# Patient Record
Sex: Male | Born: 1940 | Race: Black or African American | Hispanic: No | Marital: Married | Smoking: Never smoker
Health system: Southern US, Community
[De-identification: ages and names within clinical notes are randomized; demographics above are authoritative.]

## PROBLEM LIST (undated history)

## (undated) DIAGNOSIS — I2699 Other pulmonary embolism without acute cor pulmonale: Secondary | ICD-10-CM

## (undated) DIAGNOSIS — I10 Essential (primary) hypertension: Secondary | ICD-10-CM

## (undated) DIAGNOSIS — E119 Type 2 diabetes mellitus without complications: Secondary | ICD-10-CM

## (undated) DIAGNOSIS — E785 Hyperlipidemia, unspecified: Secondary | ICD-10-CM

## (undated) HISTORY — DX: Hyperlipidemia, unspecified: E78.5

## (undated) HISTORY — DX: Essential (primary) hypertension: I10

## (undated) HISTORY — DX: Other pulmonary embolism without acute cor pulmonale: I26.99

## (undated) HISTORY — DX: Type 2 diabetes mellitus without complications: E11.9

---

## 2019-04-10 ENCOUNTER — Ambulatory Visit: Payer: Self-pay

## 2019-04-28 ENCOUNTER — Ambulatory Visit: Payer: Medicare Other

## 2019-05-12 ENCOUNTER — Ambulatory Visit: Payer: Medicare Other | Attending: Internal Medicine

## 2019-05-12 DIAGNOSIS — Z23 Encounter for immunization: Secondary | ICD-10-CM

## 2019-05-12 NOTE — Progress Notes (Signed)
   Covid-19 Vaccination Clinic  Name:  Charles Townsend    MRN: [id number] DOB: Mar 26, 1940  05/12/2019  [name] was observed post Covid-19 immunization for 15 minutes without incident. He was provided with Vaccine Information Sheet and instruction to access the V-Safe system.   [name] was instructed to call 911 with any severe reactions post vaccine: Marland Kitchen Difficulty breathing  . Swelling of face and throat  . A fast heartbeat  . A bad rash all over body  . Dizziness and weakness   Immunizations Administered    Name Date Dose VIS Date Route   Pfizer COVID-19 Vaccine 05/12/2019  8:12 AM 0.3 mL 03/01/2018 Intramuscular   Manufacturer: [place]   Lot: [id number]   [place]: [id number]

## 2019-06-06 ENCOUNTER — Ambulatory Visit: Payer: Medicare Other | Attending: Internal Medicine

## 2019-06-06 DIAGNOSIS — Z23 Encounter for immunization: Secondary | ICD-10-CM

## 2019-06-06 NOTE — Progress Notes (Signed)
   Covid-19 Vaccination Clinic  Name:  Charles Townsend    MRN: [id number] DOB: 1940/10/05  06/06/2019  Mr. [name] was observed post Covid-19 immunization for 15 minutes without incident. He was provided with Vaccine Information Sheet and instruction to access the V-Safe system.   Mr. [name] was instructed to call 911 with any severe reactions post vaccine: Marland Kitchen Difficulty breathing  . Swelling of face and throat  . A fast heartbeat  . A bad rash all over body  . Dizziness and weakness   Immunizations Administered    Name Date Dose VIS Date Route   Pfizer COVID-19 Vaccine 06/06/2019  8:09 AM 0.3 mL 03/01/2018 Intramuscular   Manufacturer: [place]   Lot: [id number]   [place]: [id number]

## 2019-07-17 ENCOUNTER — Other Ambulatory Visit: Payer: Self-pay

## 2019-07-17 ENCOUNTER — Other Ambulatory Visit: Payer: Self-pay | Admitting: Infectious Diseases

## 2019-07-17 ENCOUNTER — Ambulatory Visit
Admission: RE | Admit: 2019-07-17 | Discharge: 2019-07-17 | Disposition: A | Discharge status: Home / Self Care | Payer: Medicare Other | Source: Ambulatory Visit | Attending: Infectious Diseases | Admitting: Infectious Diseases

## 2019-07-17 DIAGNOSIS — S0990XD Unspecified injury of head, subsequent encounter: Secondary | ICD-10-CM | POA: Diagnosis not present

## 2019-07-18 ENCOUNTER — Other Ambulatory Visit: Payer: Self-pay | Admitting: Infectious Diseases

## 2019-07-18 ENCOUNTER — Ambulatory Visit
Admission: RE | Admit: 2019-07-18 | Discharge: 2019-07-18 | Disposition: A | Discharge status: Home / Self Care | Payer: Medicare Other | Source: Ambulatory Visit | Attending: Infectious Diseases | Admitting: Infectious Diseases

## 2019-07-18 DIAGNOSIS — S065X9A Traumatic subdural hemorrhage with loss of consciousness of unspecified duration, initial encounter: Secondary | ICD-10-CM | POA: Diagnosis not present

## 2019-07-18 DIAGNOSIS — S065XAA Traumatic subdural hemorrhage with loss of consciousness status unknown, initial encounter: Secondary | ICD-10-CM

## 2019-11-02 ENCOUNTER — Other Ambulatory Visit
Admission: RE | Admit: 2019-11-02 | Discharge: 2019-11-02 | Disposition: A | Discharge status: Home / Self Care | Payer: Medicare Other | Source: Ambulatory Visit | Attending: Podiatry | Admitting: Podiatry

## 2019-11-02 DIAGNOSIS — L03116 Cellulitis of left lower limb: Secondary | ICD-10-CM | POA: Diagnosis present

## 2019-11-07 LAB — AEROBIC/ANAEROBIC CULTURE W GRAM STAIN (SURGICAL/DEEP WOUND)

## 2020-01-09 ENCOUNTER — Ambulatory Visit: Payer: Medicare Other | Attending: Internal Medicine

## 2020-01-09 ENCOUNTER — Other Ambulatory Visit: Payer: Self-pay

## 2020-01-09 DIAGNOSIS — Z23 Encounter for immunization: Secondary | ICD-10-CM

## 2020-01-09 NOTE — Progress Notes (Signed)
   Covid-19 Vaccination Clinic  Name:  Charles Townsend    MRN: [id number] DOB: Oct 17, 1940  01/09/2020  Charles Townsend was observed post Covid-19 immunization for 15 minutes without incident. He was provided with Vaccine Information Sheet and instruction to access the V-Safe system.   Charles Townsend was instructed to call 911 with any severe reactions post vaccine: Marland Kitchen Difficulty breathing  . Swelling of face and throat  . A fast heartbeat  . A bad rash all over body  . Dizziness and weakness   Immunizations Administered    Name Date Dose VIS Date Route   Pfizer COVID-19 Vaccine 01/09/2020  2:16 PM 0.3 mL 10/25/2019 Intramuscular   Manufacturer: [employer], [employer]   Lot: [id number]   [place]: [address]-3

## 2020-04-04 ENCOUNTER — Other Ambulatory Visit: Payer: Self-pay | Admitting: Physician Assistant

## 2020-04-04 ENCOUNTER — Other Ambulatory Visit: Payer: Self-pay | Admitting: Student

## 2020-04-04 DIAGNOSIS — S069X9S Unspecified intracranial injury with loss of consciousness of unspecified duration, sequela: Secondary | ICD-10-CM

## 2020-04-04 DIAGNOSIS — I639 Cerebral infarction, unspecified: Secondary | ICD-10-CM

## 2020-04-22 ENCOUNTER — Ambulatory Visit
Admission: RE | Admit: 2020-04-22 | Discharge: 2020-04-22 | Disposition: A | Discharge status: Home / Self Care | Payer: Medicare Other | Source: Ambulatory Visit | Attending: Physician Assistant | Admitting: Physician Assistant

## 2020-04-22 ENCOUNTER — Other Ambulatory Visit: Payer: Self-pay

## 2020-04-22 DIAGNOSIS — I639 Cerebral infarction, unspecified: Secondary | ICD-10-CM | POA: Insufficient documentation

## 2020-06-26 ENCOUNTER — Ambulatory Visit: Payer: Medicare Other | Attending: Physician Assistant

## 2020-06-26 DIAGNOSIS — R2689 Other abnormalities of gait and mobility: Secondary | ICD-10-CM | POA: Insufficient documentation

## 2020-06-26 DIAGNOSIS — R2681 Unsteadiness on feet: Secondary | ICD-10-CM | POA: Diagnosis not present

## 2020-06-26 NOTE — Patient Instructions (Signed)
  Access Code: [email or web address] URL: [email or web address] Date: 06/26/2020 Prepared by: Janna Arch  Exercises Standing March with Unilateral Counter Support - 1 x daily - 7 x weekly - 2 sets - 10 reps - 5 hold Standing Tandem Balance with Counter Support - 1 x daily - 7 x weekly - 2 sets - 2 reps - 30 hold Seated Heel Toe Raises - 1 x daily - 7 x weekly - 2 sets - 10 reps - 5 hold Sit to Stand Without Arm Support - 1 x daily - 7 x weekly - 2 sets - 10 reps - 5 hold

## 2020-06-26 NOTE — Therapy (Signed)
[place] MAIN [facility] SERVICES [address] [place], [place], [address] Phone: [phone]   Fax:  [phone]  Physical Therapy Evaluation  Patient Details  Name: Charles Townsend MRN: [id number] Date of Birth: [birth year] Referring Provider (PT): Wonda Cerise  Encounter Date: 06/26/2020   PT End of Session - 06/26/20 1723     Visit Number 1    Number of Visits 24    Date for PT Re-Evaluation 09/18/20    Authorization Type 1/10 eval 06/26/20    PT Start Time 5188    PT Stop Time 1711    PT Time Calculation (min) 56 min    Equipment Utilized During Treatment Gait belt    Activity Tolerance Patient tolerated treatment well    Behavior During Therapy [facility] for tasks assessed/performed             History reviewed. No pertinent past medical history.  History reviewed. No pertinent surgical history.  There were no vitals filed for this visit.    Subjective Assessment - 06/26/20 1622     Subjective Patient is a pleasant 80 year old who presents for balance and walking.    Pertinent History Patient is a pleasant 80 year old who presents for balance and walking. Reports it has began about a year ago and has gotten progressively worse.  When he tries to walk he falls .Patient uses a cane mostly, stumbles frequently and falls 3x/month, normally forward.  PMH includes blindness in R eye, type II diabetes, HTN, HLD, stage 3a CKD, CHB, chronic renal failure, b12 deficiency, macular degeneration, hypercholesterolemia, PCO, cardiac pacemaker, PVCs, neuropathy. CT was clear with no evidence of acute intracranial abnormalities.    Limitations Lifting;Standing;Walking;House hold activities    How long can you sit comfortably? n/a    How long can you stand comfortably? 30 mins    How long can you walk comfortably? as he walks he starts to drift    Patient Stated Goals increase his walking, decrease falls    Currently in Pain? No/denies                  Missing toe amputation L foot   PAIN: Patient reports no pain.   POSTURE: Slight left lean in seated and standing. Posterior trunk sway in standing   PROM/AROM:  AROM BUE WFL  AROM BLE:  WFL  STRENGTH:  Graded on a 0-5 scale Muscle Group Left Right  Hip Flex 4/5 4/5  Hip Abd 4/5 4/5  Hip Add 4/5 4/5  Hip Ext 3/5 3/5  Hip IR/ER /5 /5  Knee Flex 4-/5 4-/5  Knee Ext 4/5 4/5  Ankle DF 4-/5 4-/5  Ankle PF 4-/5 4-/5   SENSATION:     SOMATOSENSORY:  Any N & T in extremities or weakness: reports :         Sensation           Intact      Diminished         Absent  Light touch LEs                              COORDINATION: Finger to Nose: Dysmetric with past pointing of RUE >LUE           Heel Shin Slide Test: [facility]   SPECIAL TESTS: Cranial Nerves/Vision : able to track with L eye, unable to see out of R eye.  FUNCTIONAL MOBILITY: STS: performed with one posterior LOB   BALANCE: Static Sitting Balance  Normal Able to maintain balance against maximal resistance   Good Able to maintain balance against moderate resistance   Good-/Fair+ Accepts minimal resistance x  Fair Able to sit unsupported without balance loss and without UE support   Poor+ Able to maintain with Minimal assistance from individual or chair   Poor Unable to maintain balance-requires mod/max support from individual or chair    Static Standing Balance  Normal Able to maintain standing balance against maximal resistance   Good Able to maintain standing balance against moderate resistance   Good-/Fair+ Able to maintain standing balance against minimal resistance   Fair Able to stand unsupported without UE support and without LOB for 1-2 min x  Fair- Requires Min A and UE support to maintain standing without loss of balance   Poor+ Requires mod A and UE support to maintain standing without loss of balance   Poor Requires max A and UE support to maintain standing balance without loss     Dynamic Sitting Balance  Normal Able to sit unsupported and weight shift across midline maximally   Good Able to sit unsupported and weight shift across midline moderately   Good-/Fair+ Able to sit unsupported and weight shift across midline minimally   Fair Minimal weight shifting ipsilateral/front, difficulty crossing midline x  Fair- Reach to ipsilateral side and unable to weight shift   Poor + Able to sit unsupported with min A and reach to ipsilateral side, unable to weight shift   Poor Able to sit unsupported with mod A and reach ipsilateral/front-can't cross midline    Standing Dynamic Balance  Normal Stand independently unsupported, able to weight shift and cross midline maximally   Good Stand independently unsupported, able to weight shift and cross midline moderately   Good-/Fair+ Stand independently unsupported, able to weight shift across midline minimally   Fair Stand independently unsupported, weight shift, and reach ipsilaterally, loss of balance when crossing midline x  Poor+ Able to stand with Min A and reach ipsilaterally, unable to weight shift   Poor Able to stand with Mod A and minimally reach ipsilaterally, unable to cross midline.      GAIT: Bilateral feet shuffling, use of SPC, weaving L and R, requires PT assistance to not lose balance  OUTCOME MEASURES: TEST Outcome Interpretation  DGI with cane 10/24           6 minute walk test    1100            Feet; frequent near LOB 1000 feet is community Conservator, museum/gallery Assessment 36/56 <36/56 (100% risk for falls), 37-45 (80% risk for falls); 46-51 (>50% risk for falls); 52-55 (lower risk <25% of falls)  FOTO 50% Predicted d/c score of 62%       [facility] PT Assessment - 06/26/20 0001       Assessment   Medical Diagnosis balance/gait deficit    Referring Provider (PT) Wonda Cerise    Onset Date/Surgical Date --   about a year ago   Hand Dominance Right    Prior Therapy yes for knees       Precautions   Precautions ICD/Pacemaker;Fall      Restrictions   Weight Bearing Restrictions No      Balance Screen   Has the patient fallen in the past 6 months Yes    How many times? 8 or more times    Has  the patient had a decrease in activity level because of a fear of falling?  Yes    Is the patient reluctant to leave their home because of a fear of falling?  No      Home Environment   Living Environment Private residence    Living Arrangements Spouse/significant other    Available Help at Discharge Family    Type of [place] Access Level entry   but three steps to go down   Home Layout One level   one step in living [place] - single point;Grab bars - toilet;Grab bars - tub/shower;Shower seat      Prior Function   Level of Independence Independent    Vocation Retired    Leisure watch, walking, drive and have adventures      Cognition   Overall Cognitive Status Within Functional Limits for tasks assessed      Observation/Other Assessments   Focus on Therapeutic Outcomes (FOTO)  50%      Standardized Balance Assessment   Standardized Balance Assessment Berg Balance Test;Dynamic Gait Index      Berg Balance Test   Sit to Stand Able to stand  independently using hands    Standing Unsupported Able to stand 2 minutes with supervision    Sitting with Back Unsupported but Feet Supported on Floor or Stool Able to sit safely and securely 2 minutes    Stand to Sit Controls descent by using hands    Transfers Able to transfer safely, definite need of hands    Standing Unsupported with Eyes Closed Able to stand 3 seconds    Standing Unsupported with Feet Together Able to place feet together independently but unable to hold for 30 seconds    From Standing, Reach Forward with Outstretched Arm Can reach forward >5 cm safely (2")    From Standing Position, Pick up Object from Floor Able to pick up shoe, needs supervision    From Standing Position, Turn to  Look Behind Over each Shoulder Looks behind one side only/other side shows less weight shift    Turn 360 Degrees Able to turn 360 degrees safely but slowly    Standing Unsupported, Alternately Place Feet on Step/Stool Able to stand independently and complete 8 steps >20 seconds    Standing Unsupported, One Foot in Front Able to take small step independently and hold 30 seconds    Standing on One Leg Tries to lift leg/unable to hold 3 seconds but remains standing independently    Total Score 36      Dynamic Gait Index   Level Surface Mild Impairment   uses SPC   Change in Gait Speed Moderate Impairment    Gait with Horizontal Head Turns Moderate Impairment    Gait with Vertical Head Turns Moderate Impairment    Gait and Pivot Turn Moderate Impairment    Step Over Obstacle Moderate Impairment    Step Around Obstacles Moderate Impairment    Steps Mild Impairment    Total Score 10            Access Code: [email or web address] URL: [email or web address] Date: 06/26/2020 Prepared by: Janna Arch  Exercises Standing March with Unilateral Counter Support - 1 x daily - 7 x weekly - 2 sets - 10 reps - 5 hold Standing Tandem Balance with Counter Support - 1 x daily - 7 x weekly - 2 sets - 2 reps - 30 hold Seated Heel Toe Raises - 1 x  daily - 7 x weekly - 2 sets - 10 reps - 5 hold Sit to Stand Without Arm Support - 1 x daily - 7 x weekly - 2 sets - 10 reps - 5 hold          Objective measurements completed on examination: See above findings.               PT Education - 06/26/20 1723     Education Details goals, POC, HEP    Person(s) Educated Patient;Spouse    Methods Explanation;Demonstration;Tactile cues;Verbal cues;Handout    Comprehension Verbalized understanding;Returned demonstration;Verbal cues required;Tactile cues required              PT Short Term Goals - 06/26/20 1727       PT SHORT TERM GOAL #1   Title Patient will be independent in home  exercise program to improve strength/mobility for better functional independence with ADLs    Baseline 6/22: HEP given    Time 4    Period Weeks    Status New    Target Date 07/24/20               PT Long Term Goals - 06/26/20 1728       PT LONG TERM GOAL #1   Title Patient will increase FOTO score to equal to or greater than  62%   to demonstrate statistically significant improvement in mobility and quality of life.    Baseline 6/22: 50%    Time 12    Period Weeks    Status New    Target Date 09/18/20      PT LONG TERM GOAL #2   Title Patient will increase Berg Balance score by > 6 points (42/56)  to demonstrate decreased fall risk during functional activities.    Baseline 6/22: 36/56    Time 12    Period Weeks    Status New    Target Date 09/18/20      PT LONG TERM GOAL #3   Title Patient will increase six minute walk test distance to >1500 without LOB for progression to age norm community ambulator and improve gait ability    Baseline 6/22: 1100 ft with frequent LOB and use of SPC    Time 12    Period Weeks    Status New    Target Date 09/18/20      PT LONG TERM GOAL #4   Title Patient will increase dynamic gait index score to >19/24 as to demonstrate reduced fall risk and improved dynamic gait balance for better safety with community/home ambulation.    Baseline 6/22: 10/24 with cane    Time 12    Period Weeks    Status New    Target Date 09/18/20                    Plan - 06/26/20 1724     Clinical Impression Statement Patient is a pleasant 80 year old man who presents to physical therapy for gait and instability. Patient is highly motivated and has a good prognosis. He has vision loss in R eye resulting in frequent bumping into objects. Prolonged ambulation with bilateral foot drag and weaving L and R as he fatigues. Sit to stands result in occasional posterior LOB as he has posterior trunk sway. DGI scored 10/24 with SPC and BERG with 36/56. He  performed 6 min walk test with 1100 ft with SPC and frequent near LOB. Patient will benefit from skilled  physical therapy to improve balance, gait mechanics, and overall mobility for decreased fall risk and improved quality of life.    Personal Factors and Comorbidities Age;Comorbidity 3+;Social Background;Time since onset of injury/illness/exacerbation;Fitness;Transportation    Comorbidities blindness in R eye, type II diabetes, HTN, HLD, stage 3a CKD, CHB, chronic renal failure, b12 deficiency, macular degeneration, hypercholesterolemia, PCO, cardiac pacemaker, PVCs, neuropathy    Examination-Activity Limitations Bed Mobility;Bend;Carry;Dressing;Reach Overhead;Locomotion Level;Lift;Squat;Stairs;Stand;Toileting;Transfers    Examination-Participation Restrictions Cleaning;Community Activity;Church;Driving;Meal Prep;Laundry;Shop;Volunteer;Yard Work    [employer]chant [employer] officer Evolving/Moderate complexity    Clinical Decision Making Moderate    Rehab Potential Good    PT Frequency 2x / week    PT Duration 12 weeks    PT Treatment/Interventions ADLs/Self Care Home Management;Biofeedback;Canalith Repostioning;Cryotherapy;Moist Heat;Traction;Ultrasound;Functional mobility training;Stair training;Gait training;DME Instruction;Therapeutic activities;Therapeutic exercise;Balance training;Neuromuscular re-education;Cognitive remediation;Manual techniques;Orthotic Fit/Training;Patient/family education;Passive range of motion;Dry needling;Vestibular;Taping;Energy conservation;Visual/perceptual remediation/compensation    PT Next Visit Plan walk with walking sticks    PT Home Exercise Plan see above    Consulted and Agree with Plan of Care Patient             Patient will benefit from skilled therapeutic intervention in order to improve the following deficits and impairments:  Abnormal gait, Decreased activity tolerance, Decreased balance, Decreased endurance, Decreased coordination, Decreased  knowledge of use of DME, Decreased mobility, Difficulty walking, Decreased strength, Increased edema, Impaired perceived functional ability, Impaired vision/preception, Postural dysfunction, Improper body mechanics  Visit Diagnosis: Unsteadiness on feet  Other abnormalities of gait and mobility     Problem List There are no problems to display for this patient.   Janna Arch, PT, DPT  06/26/2020, 5:31 PM  [place] MAIN [facility] SERVICES [address] [place], [place], [address] Phone: [phone]   Fax:  [phone]  Name: Charles Townsend MRN: [id number] Date of Birth: 1940/10/04

## 2020-06-27 ENCOUNTER — Other Ambulatory Visit: Payer: Self-pay

## 2020-06-28 ENCOUNTER — Ambulatory Visit: Payer: Medicare Other

## 2020-07-01 ENCOUNTER — Ambulatory Visit: Payer: Medicare Other

## 2020-07-01 ENCOUNTER — Other Ambulatory Visit: Payer: Self-pay

## 2020-07-01 DIAGNOSIS — R2689 Other abnormalities of gait and mobility: Secondary | ICD-10-CM

## 2020-07-01 DIAGNOSIS — R2681 Unsteadiness on feet: Secondary | ICD-10-CM | POA: Diagnosis not present

## 2020-07-01 NOTE — Therapy (Signed)
[place] MAIN [facility] SERVICES [address] [place], [place], [address] Phone: [phone]   Fax:  [phone]  Physical Therapy Treatment  Patient Details  Name: Charles Townsend MRN: [id number] Date of Birth: 25-Mar-1940 Referring Provider (PT): Wonda Cerise   Encounter Date: 07/01/2020   PT End of Session - 07/01/20 0758     Visit Number 2    Number of Visits 24    Date for PT Re-Evaluation 09/18/20    Authorization Type 2/10 eval 06/26/20    PT Start Time 0800    PT Stop Time 0844    PT Time Calculation (min) 44 min    Equipment Utilized During Treatment Gait belt    Activity Tolerance Patient tolerated treatment well    Behavior During Therapy [facility] for tasks assessed/performed             History reviewed. No pertinent past medical history.  History reviewed. No pertinent surgical history.  There were no vitals filed for this visit.   Subjective Assessment - 07/01/20 0803     Subjective Patient reports he tried to do his two blocks and fell. Walked down and when he came back he got halfway and was struggling.    Pertinent History Patient is a pleasant 80 year old who presents for balance and walking. Reports it has began about a year ago and has gotten progressively worse.  When he tries to walk he falls .Patient uses a cane mostly, stumbles frequently and falls 3x/month, normally forward.  PMH includes blindness in R eye, type II diabetes, HTN, HLD, stage 3a CKD, CHB, chronic renal failure, b12 deficiency, macular degeneration, hypercholesterolemia, PCO, cardiac pacemaker, PVCs, neuropathy. CT was clear with no evidence of acute intracranial abnormalities.    Limitations Lifting;Standing;Walking;House hold activities    How long can you sit comfortably? n/a    How long can you stand comfortably? 30 mins    How long can you walk comfortably? as he walks he starts to drift    Patient Stated Goals increase his walking, decrease  falls    Currently in Pain? No/denies              Patient reports he tried to do his two blocks and fell. Walked down and when he came back he got halfway and was struggling.   Neuro Re-ed:  ambulate in hallway: frequent LOB with horizontal head turns requiring mod A to retain COM.  -horizontal head turns 86 ft with cues for reading alphabet from cards 2 sets -horizontal head turns 86 ft with cues for reading number and symbols from cards 2 sets  -"red light/green light" for sudden initiation/termination of ambulation with close CGA for carryover to natural environment 2x 86 ft   Standing with CGA next to support surface:  Airex pad: static stand 30 seconds x 2 trials, noticeable trembling of ankles/LE's with fatigue and challenge to maintain stability; severe posterior LOB Airex pad: horizontal head turns 30 seconds scanning room 10x ; cueing for arc of motion ; frequent LOB to the L  Airex pad: vertical head turns 30 seconds, cueing for arc of motion, noticeable sway with upward gaze increasing demand on ankle righting reaction musculature Airex pad: one foot on 6" step one foot on airex pad, hold position for 30 seconds, switch legs, 2x each LE;  Three cone taps 8x each LE ; BUE support  TherEx:  Ambulate with walking sticks ; max cueing for sequencing, very challenging for  patient 2x 90 ft; terminated due to increased tripping hazard  10x STS one near LOB       Pt educated throughout session about proper posture and technique with exercises. Improved exercise technique, movement at target joints, use of target muscles after min to mod verbal, visual, tactile cues   Patient is highly motivated throughout physical therapy session. Educated on importance of compliance with HEP for progression. Patient has left ward and posterior loss of balance multiple times throughout session requiring max cueing for correction and occasional mod A to stabilize patient. Patient will benefit  from skilled physical therapy to improve balance, gait mechanics, and overall mobility for decreased fall risk and improved quality of life.                  PT Education - 07/01/20 0757     Education Details exercise technique, body mechanics    Person(s) Educated Patient    Methods Explanation;Demonstration;Tactile cues;Verbal cues    Comprehension Verbalized understanding;Returned demonstration;Verbal cues required;Tactile cues required              PT Short Term Goals - 06/26/20 1727       PT SHORT TERM GOAL #1   Title Patient will be independent in home exercise program to improve strength/mobility for better functional independence with ADLs    Baseline 6/22: HEP given    Time 4    Period Weeks    Status New    Target Date 07/24/20               PT Long Term Goals - 06/26/20 1728       PT LONG TERM GOAL #1   Title Patient will increase FOTO score to equal to or greater than  62%   to demonstrate statistically significant improvement in mobility and quality of life.    Baseline 6/22: 50%    Time 12    Period Weeks    Status New    Target Date 09/18/20      PT LONG TERM GOAL #2   Title Patient will increase Berg Balance score by > 6 points (42/56)  to demonstrate decreased fall risk during functional activities.    Baseline 6/22: 36/56    Time 12    Period Weeks    Status New    Target Date 09/18/20      PT LONG TERM GOAL #3   Title Patient will increase six minute walk test distance to >1500 without LOB for progression to age norm community ambulator and improve gait ability    Baseline 6/22: 1100 ft with frequent LOB and use of SPC    Time 12    Period Weeks    Status New    Target Date 09/18/20      PT LONG TERM GOAL #4   Title Patient will increase dynamic gait index score to >19/24 as to demonstrate reduced fall risk and improved dynamic gait balance for better safety with community/home ambulation.    Baseline 6/22: 10/24 with  cane    Time 12    Period Weeks    Status New    Target Date 09/18/20                   Plan - 07/01/20 0957     Clinical Impression Statement Patient is highly motivated throughout physical therapy session. Educated on importance of compliance with HEP for progression. Patient has left ward and posterior loss of balance multiple  times throughout session requiring max cueing for correction and occasional mod A to stabilize patient. Patient will benefit from skilled physical therapy to improve balance, gait mechanics, and overall mobility for decreased fall risk and improved quality of life.    Personal Factors and Comorbidities Age;Comorbidity 3+;Social Background;Time since onset of injury/illness/exacerbation;Fitness;Transportation    Comorbidities blindness in R eye, type II diabetes, HTN, HLD, stage 3a CKD, CHB, chronic renal failure, b12 deficiency, macular degeneration, hypercholesterolemia, PCO, cardiac pacemaker, PVCs, neuropathy    Examination-Activity Limitations Bed Mobility;Bend;Carry;Dressing;Reach Overhead;Locomotion Level;Lift;Squat;Stairs;Stand;Toileting;Transfers    Examination-Participation Restrictions Cleaning;Community Activity;Church;Driving;Meal Prep;Laundry;Shop;Volunteer;Yard Work    [employer] Evolving/Moderate complexity    Rehab Potential Good    PT Frequency 2x / week    PT Duration 12 weeks    PT Treatment/Interventions ADLs/Self Care Home Management;Biofeedback;Canalith Repostioning;Cryotherapy;Moist Heat;Traction;Ultrasound;Functional mobility training;Stair training;Gait training;DME Instruction;Therapeutic activities;Therapeutic exercise;Balance training;Neuromuscular re-education;Cognitive remediation;Manual techniques;Orthotic Fit/Training;Patient/family education;Passive range of motion;Dry needling;Vestibular;Taping;Energy conservation;Visual/perceptual remediation/compensation    PT Next Visit Plan walk with walking sticks     PT Home Exercise Plan see above    Consulted and Agree with Plan of Care Patient             Patient will benefit from skilled therapeutic intervention in order to improve the following deficits and impairments:  Abnormal gait, Decreased activity tolerance, Decreased balance, Decreased endurance, Decreased coordination, Decreased knowledge of use of DME, Decreased mobility, Difficulty walking, Decreased strength, Increased edema, Impaired perceived functional ability, Impaired vision/preception, Postural dysfunction, Improper body mechanics  Visit Diagnosis: Unsteadiness on feet  Other abnormalities of gait and mobility     Problem List There are no problems to display for this patient.   Janna Arch, PT, DPT   07/01/2020, 9:57 AM  [place] MAIN [facility] SERVICES [address] [place], [place], [address] Phone: [phone]   Fax:  [phone]  Name: Charles Townsend MRN: [id number] Date of Birth: 1940-03-02

## 2020-07-03 ENCOUNTER — Ambulatory Visit: Payer: Medicare Other

## 2020-07-03 ENCOUNTER — Other Ambulatory Visit: Payer: Self-pay

## 2020-07-03 DIAGNOSIS — R2689 Other abnormalities of gait and mobility: Secondary | ICD-10-CM

## 2020-07-03 DIAGNOSIS — R2681 Unsteadiness on feet: Secondary | ICD-10-CM

## 2020-07-03 NOTE — Therapy (Signed)
[place] MAIN [facility] SERVICES [address] [place], [place], [address] Phone: [phone]   Fax:  [phone]  Physical Therapy Treatment  Patient Details  Name: Charles Townsend MRN: [id number] Date of Birth: [birth year] Referring Provider (PT): Wonda Cerise   Encounter Date: 07/03/2020   PT End of Session - 07/03/20 0803     Visit Number 3    Number of Visits 24    Date for PT Re-Evaluation 09/18/20    Authorization Type 3/10 eval 06/26/20    PT Start Time 0800    PT Stop Time 0844    PT Time Calculation (min) 44 min    Equipment Utilized During Treatment Gait belt    Activity Tolerance Patient tolerated treatment well    Behavior During Therapy [facility] for tasks assessed/performed             History reviewed. No pertinent past medical history.  History reviewed. No pertinent surgical history.  There were no vitals filed for this visit.   Subjective Assessment - 07/03/20 0802     Subjective Patient reports he has been doing homework. Has some questions on frequency and sit to stands.    Pertinent History Patient is a pleasant 80 year old who presents for balance and walking. Reports it has began about a year ago and has gotten progressively worse.  When he tries to walk he falls .Patient uses a cane mostly, stumbles frequently and falls 3x/month, normally forward.  PMH includes blindness in R eye, type II diabetes, HTN, HLD, stage 3a CKD, CHB, chronic renal failure, b12 deficiency, macular degeneration, hypercholesterolemia, PCO, cardiac pacemaker, PVCs, neuropathy. CT was clear with no evidence of acute intracranial abnormalities.    Limitations Lifting;Standing;Walking;House hold activities    How long can you sit comfortably? n/a    How long can you stand comfortably? 30 mins    How long can you walk comfortably? as he walks he starts to drift    Patient Stated Goals increase his walking, decrease falls    Currently in Pain?  No/denies                 Neuro Re-ed:    Standing with CGA next to support surface:  Airex pad: horizontal head turns 30 seconds scanning room 10x ; cueing for arc of motion ; frequent LOB to the L  Airex pad: vertical head turns 30 seconds, cueing for arc of motion, noticeable sway with upward gaze increasing demand on ankle righting reaction musculature Airex pad: one foot on 6" step one foot on airex pad, hold position for 30 seconds, switch legs, 2x each LE;very challenging for patient  Airex balance beam: lateral stepping with finger tip support 6x length of // bars. Multiple posterior LOB  Bosu ball:  -static stand flat side up 30 seconds ; heavy support required -round side up: modified lunges 10x -round side up: lateral modified lunges 10x  Single limb stance hold 30 seconds x 2 trials each LE    Negotiate room with scanning left, right, up and down to find cones interspersed around perimeter x 86 ft   TherEx: Octane fitness lvl 4; 4 minutes for cardiovascular challenge/support 6" step up/down 12x each LE; BUE support, challenging mentally for picking which leg to step down with.  Walking lunges 4x length of // bars BUE support, patient reports fatiguing        Pt educated throughout session about proper posture and technique with exercises. Improved exercise  technique, movement at target joints, use of target muscles after min to mod verbal, visual, tactile cues       Patient is highly motivated throughout physical therapy session. His capacity for functional mobility is limited with prolonged activity. He has poor ankle righting reactions with unstable surfaces leading to posterior LOB. Patient will benefit from skilled physical therapy to improve balance, gait mechanics, and overall mobility for decreased fall risk and improved quality of life.                 PT Education - 07/03/20 0803     Education Details exercise technique, body  mechanics    Person(s) Educated Patient    Methods Explanation;Demonstration;Tactile cues;Verbal cues    Comprehension Verbalized understanding;Returned demonstration;Verbal cues required;Tactile cues required              PT Short Term Goals - 06/26/20 1727       PT SHORT TERM GOAL #1   Title Patient will be independent in home exercise program to improve strength/mobility for better functional independence with ADLs    Baseline 6/22: HEP given    Time 4    Period Weeks    Status New    Target Date 07/24/20               PT Long Term Goals - 06/26/20 1728       PT LONG TERM GOAL #1   Title Patient will increase FOTO score to equal to or greater than  62%   to demonstrate statistically significant improvement in mobility and quality of life.    Baseline 6/22: 50%    Time 12    Period Weeks    Status New    Target Date 09/18/20      PT LONG TERM GOAL #2   Title Patient will increase Berg Balance score by > 6 points (42/56)  to demonstrate decreased fall risk during functional activities.    Baseline 6/22: 36/56    Time 12    Period Weeks    Status New    Target Date 09/18/20      PT LONG TERM GOAL #3   Title Patient will increase six minute walk test distance to >1500 without LOB for progression to age norm community ambulator and improve gait ability    Baseline 6/22: 1100 ft with frequent LOB and use of SPC    Time 12    Period Weeks    Status New    Target Date 09/18/20      PT LONG TERM GOAL #4   Title Patient will increase dynamic gait index score to >19/24 as to demonstrate reduced fall risk and improved dynamic gait balance for better safety with community/home ambulation.    Baseline 6/22: 10/24 with cane    Time 12    Period Weeks    Status New    Target Date 09/18/20                   Plan - 07/03/20 1062     Clinical Impression Statement Patient is highly motivated throughout physical therapy session. His capacity for functional  mobility is limited with prolonged activity. He has poor ankle righting reactions with unstable surfaces leading to posterior LOB. Patient will benefit from skilled physical therapy to improve balance, gait mechanics, and overall mobility for decreased fall risk and improved quality of life.    Personal Factors and Comorbidities Age;Comorbidity 3+;Social Background;Time since onset of injury/illness/exacerbation;Fitness;Transportation  Comorbidities blindness in R eye, type II diabetes, HTN, HLD, stage 3a CKD, CHB, chronic renal failure, b12 deficiency, macular degeneration, hypercholesterolemia, PCO, cardiac pacemaker, PVCs, neuropathy    Examination-Activity Limitations Bed Mobility;Bend;Carry;Dressing;Reach Overhead;Locomotion Level;Lift;Squat;Stairs;Stand;Toileting;Transfers    Examination-Participation Restrictions Cleaning;Community Activity;Church;Driving;Meal Prep;Laundry;Shop;Volunteer;Yard Work    [employer] officer Evolving/Moderate complexity    Rehab Potential Good    PT Frequency 2x / week    PT Duration 12 weeks    PT Treatment/Interventions ADLs/Self Care Home Management;Biofeedback;Canalith Repostioning;Cryotherapy;Moist Heat;Traction;Ultrasound;Functional mobility training;Stair training;Gait training;DME Instruction;Therapeutic activities;Therapeutic exercise;Balance training;Neuromuscular re-education;Cognitive remediation;Manual techniques;Orthotic Fit/Training;Patient/family education;Passive range of motion;Dry needling;Vestibular;Taping;Energy conservation;Visual/perceptual remediation/compensation    PT Next Visit Plan walk with walking sticks    PT Home Exercise Plan see above    Consulted and Agree with Plan of Care Patient             Patient will benefit from skilled therapeutic intervention in order to improve the following deficits and impairments:  Abnormal gait, Decreased activity tolerance, Decreased balance, Decreased endurance, Decreased  coordination, Decreased knowledge of use of DME, Decreased mobility, Difficulty walking, Decreased strength, Increased edema, Impaired perceived functional ability, Impaired vision/preception, Postural dysfunction, Improper body mechanics  Visit Diagnosis: Unsteadiness on feet  Other abnormalities of gait and mobility     Problem List There are no problems to display for this patient.   Janna Arch, PT, DPT  07/03/2020, 8:45 AM  [place] MAIN [facility] SERVICES [address] [place], [place], [address] Phone: [phone]   Fax:  [phone]  Name: Charles Townsend MRN: [id number] Date of Birth: 1940-04-25

## 2020-07-10 ENCOUNTER — Other Ambulatory Visit: Payer: Self-pay

## 2020-07-10 ENCOUNTER — Ambulatory Visit: Payer: Medicare Other | Attending: Physician Assistant

## 2020-07-10 DIAGNOSIS — R2681 Unsteadiness on feet: Secondary | ICD-10-CM | POA: Insufficient documentation

## 2020-07-10 DIAGNOSIS — R2689 Other abnormalities of gait and mobility: Secondary | ICD-10-CM | POA: Diagnosis present

## 2020-07-10 NOTE — Therapy (Signed)
[place] MAIN [facility] SERVICES [address] [place], [place], [address] Phone: [phone]   Fax:  [phone]  Physical Therapy Treatment  Patient Details  Name: Charles Townsend MRN: [id number] Date of Birth: [birth year] Referring Provider (PT): Wonda Cerise   Encounter Date: 07/10/2020   PT End of Session - 07/10/20 0827     Visit Number 4    Number of Visits 24    Date for PT Re-Evaluation 09/18/20    Authorization Type 4/10 eval 06/26/20    PT Start Time 0807    PT Stop Time 0845    PT Time Calculation (min) 38 min    Equipment Utilized During Treatment Gait belt    Activity Tolerance Patient tolerated treatment well    Behavior During Therapy [facility] for tasks assessed/performed             History reviewed. No pertinent past medical history.  History reviewed. No pertinent surgical history.  There were no vitals filed for this visit.   Subjective Assessment - 07/10/20 0828     Subjective Patient reports having a good weekend. No falls but multiple stumbles. Has been compliant with HEP.    Pertinent History Patient is a pleasant 80 year old who presents for balance and walking. Reports it has began about a year ago and has gotten progressively worse.  When he tries to walk he falls .Patient uses a cane mostly, stumbles frequently and falls 3x/month, normally forward.  PMH includes blindness in R eye, type II diabetes, HTN, HLD, stage 3a CKD, CHB, chronic renal failure, b12 deficiency, macular degeneration, hypercholesterolemia, PCO, cardiac pacemaker, PVCs, neuropathy. CT was clear with no evidence of acute intracranial abnormalities.    Limitations Lifting;Standing;Walking;House hold activities    How long can you sit comfortably? n/a    How long can you stand comfortably? 30 mins    How long can you walk comfortably? as he walks he starts to drift    Patient Stated Goals increase his walking, decrease falls    Currently in Pain?  No/denies                   Neuro Re-ed:     Standing with CGA next to support surface:   Airex pad: horizontal head turns 30 seconds scanning room 10x ; cueing for arc of motion ; frequent LOB to the L  Airex pad: vertical head turns 30 seconds, cueing for arc of motion, noticeable sway with upward gaze increasing demand on ankle righting reaction musculature Airex pad: one foot on 6" step one foot on airex pad, hold position for 30 seconds, switch legs, 2x each LE;very challenging for patient Airex pad: weighted ball chest press (2000 gr) 10x, straight arm raise 10x, twist 10x   Bosu ball: -static stand flat side up 60 seconds ; heavy support required due to posterior LOB   Single limb stance hold 30 seconds x 2 trials each LE     TherEx: RTB around bilateral ankle 6x length of // bars cues for picking up LLE Walking lunges 4x length of // bars BUE support, patient reports fatiguing  heel raises 20x with BUE support  Seated:  RTB around bilateral ankles: alternating LAQ 10x each LE       Pt educated throughout session about proper posture and technique with exercises. Improved exercise technique, movement at target joints, use of target muscles after min to mod verbal, visual, tactile cues   Patient has limited  ankle righting reactions resulting in challenge with unstable surface and an area for continued focus throughout session. Patient is highly motivated and was given a new printout of schedule due to forgetting appointment time. Patient will benefit from skilled physical therapy to improve balance, gait mechanics, and overall mobility for decreased fall risk and improved quality of life                 PT Education - 07/10/20 0828     Education Details exercise technique, body mechanics    Person(s) Educated Patient    Methods Explanation;Demonstration;Tactile cues;Verbal cues    Comprehension Verbalized understanding;Returned demonstration;Verbal  cues required;Tactile cues required              PT Short Term Goals - 06/26/20 1727       PT SHORT TERM GOAL #1   Title Patient will be independent in home exercise program to improve strength/mobility for better functional independence with ADLs    Baseline 6/22: HEP given    Time 4    Period Weeks    Status New    Target Date 07/24/20               PT Long Term Goals - 06/26/20 1728       PT LONG TERM GOAL #1   Title Patient will increase FOTO score to equal to or greater than  62%   to demonstrate statistically significant improvement in mobility and quality of life.    Baseline 6/22: 50%    Time 12    Period Weeks    Status New    Target Date 09/18/20      PT LONG TERM GOAL #2   Title Patient will increase Berg Balance score by > 6 points (42/56)  to demonstrate decreased fall risk during functional activities.    Baseline 6/22: 36/56    Time 12    Period Weeks    Status New    Target Date 09/18/20      PT LONG TERM GOAL #3   Title Patient will increase six minute walk test distance to >1500 without LOB for progression to age norm community ambulator and improve gait ability    Baseline 6/22: 1100 ft with frequent LOB and use of SPC    Time 12    Period Weeks    Status New    Target Date 09/18/20      PT LONG TERM GOAL #4   Title Patient will increase dynamic gait index score to >19/24 as to demonstrate reduced fall risk and improved dynamic gait balance for better safety with community/home ambulation.    Baseline 6/22: 10/24 with cane    Time 12    Period Weeks    Status New    Target Date 09/18/20                   Plan - 07/10/20 0837     Clinical Impression Statement Patient has limited ankle righting reactions resulting in challenge with unstable surface and an area for continued focus throughout session. Patient is highly motivated and was given a new printout of schedule due to forgetting appointment time. Patient will benefit  from skilled physical therapy to improve balance, gait mechanics, and overall mobility for decreased fall risk and improved quality of life    Personal Factors and Comorbidities Age;Comorbidity 3+;Social Background;Time since onset of injury/illness/exacerbation;Fitness;Transportation    Comorbidities blindness in R eye, type II diabetes, HTN, HLD, stage 3a  CKD, CHB, chronic renal failure, b12 deficiency, macular degeneration, hypercholesterolemia, PCO, cardiac pacemaker, PVCs, neuropathy    Examination-Activity Limitations Bed Mobility;Bend;Carry;Dressing;Reach Overhead;Locomotion Level;Lift;Squat;Stairs;Stand;Toileting;Transfers    Examination-Participation Restrictions Cleaning;Community Activity;Church;Driving;Meal Prep;Laundry;Shop;Volunteer;Yard Work    [employer] officer Evolving/Moderate complexity    Rehab Potential Good    PT Frequency 2x / week    PT Duration 12 weeks    PT Treatment/Interventions ADLs/Self Care Home Management;Biofeedback;Canalith Repostioning;Cryotherapy;Moist Heat;Traction;Ultrasound;Functional mobility training;Stair training;Gait training;DME Instruction;Therapeutic activities;Therapeutic exercise;Balance training;Neuromuscular re-education;Cognitive remediation;Manual techniques;Orthotic Fit/Training;Patient/family education;Passive range of motion;Dry needling;Vestibular;Taping;Energy conservation;Visual/perceptual remediation/compensation    PT Next Visit Plan walk with walking sticks    PT Home Exercise Plan see above    Consulted and Agree with Plan of Care Patient             Patient will benefit from skilled therapeutic intervention in order to improve the following deficits and impairments:  Abnormal gait, Decreased activity tolerance, Decreased balance, Decreased endurance, Decreased coordination, Decreased knowledge of use of DME, Decreased mobility, Difficulty walking, Decreased strength, Increased edema, Impaired perceived functional  ability, Impaired vision/preception, Postural dysfunction, Improper body mechanics  Visit Diagnosis: Unsteadiness on feet  Other abnormalities of gait and mobility     Problem List There are no problems to display for this patient.   Janna Arch, PT, DPT  07/10/2020, 8:45 AM  [place] MAIN [facility] SERVICES [address] [place], [place], [address] Phone: [phone]   Fax:  [phone]  Name: Charles Townsend MRN: [id number] Date of Birth: Dec 08, 1940

## 2020-07-15 ENCOUNTER — Other Ambulatory Visit: Payer: Self-pay

## 2020-07-15 ENCOUNTER — Ambulatory Visit: Payer: Medicare Other

## 2020-07-15 DIAGNOSIS — R2689 Other abnormalities of gait and mobility: Secondary | ICD-10-CM

## 2020-07-15 DIAGNOSIS — R2681 Unsteadiness on feet: Secondary | ICD-10-CM | POA: Diagnosis not present

## 2020-07-15 NOTE — Therapy (Signed)
[place] MAIN [facility] SERVICES [address] [place], [place], [address] Phone: [phone]   Fax:  [phone]  Physical Therapy Treatment  Patient Details  Name: Charles Townsend MRN: [id number] Date of Birth: Dec 16, 1940 Referring Provider (PT): Wonda Cerise   Encounter Date: 07/15/2020   PT End of Session - 07/15/20 0750     Visit Number 5    Number of Visits 24    Date for PT Re-Evaluation 09/18/20    Authorization Type 5/10 eval 06/26/20    PT Start Time 0758    PT Stop Time 0843    PT Time Calculation (min) 45 min    Equipment Utilized During Treatment Gait belt    Activity Tolerance Patient tolerated treatment well    Behavior During Therapy [facility] for tasks assessed/performed             History reviewed. No pertinent past medical history.  History reviewed. No pertinent surgical history.  There were no vitals filed for this visit.   Subjective Assessment - 07/15/20 0759     Subjective Patient reports compliance with HEP. No falls over the weekend.    Pertinent History Patient is a pleasant 80 year old who presents for balance and walking. Reports it has began about a year ago and has gotten progressively worse.  When he tries to walk he falls .Patient uses a cane mostly, stumbles frequently and falls 3x/month, normally forward.  PMH includes blindness in R eye, type II diabetes, HTN, HLD, stage 3a CKD, CHB, chronic renal failure, b12 deficiency, macular degeneration, hypercholesterolemia, PCO, cardiac pacemaker, PVCs, neuropathy. CT was clear with no evidence of acute intracranial abnormalities.    Limitations Lifting;Standing;Walking;House hold activities    How long can you sit comfortably? n/a    How long can you stand comfortably? 30 mins    How long can you walk comfortably? as he walks he starts to drift    Patient Stated Goals increase his walking, decrease falls    Currently in Pain? No/denies                 Neuro Re-ed: close CGA with cues for safety awareness and body mechanics     Standing with CGA next to support surface:  airex pad: static stand: posterior trunk sway 30 seconds Airex pad: eyes closed 30 second x 2 trials; very challenging multiple near LOB Airex pad: NBOS 30 seconds, multiple near LOB. Airex pad: horizontal head turns 30 seconds scanning room 10x ; cueing for arc of motion ; frequent LOB to the L  Airex pad: vertical head turns 30 seconds, cueing for arc of motion, noticeable sway with upward gaze increasing demand on ankle righting reaction musculature  Single limb stance hold 30 seconds x 2 trials each LE    ambulate in hallway: -horizontal head turns with cues for reading alphabet from cards 86 ftx 2 sets; multiple near LOB  -horizontal head turns with cues for reading number and symbols from cards 86 ft x 2 sets ; multiple near LOB  -"red light/green light" for sudden initiation/termination of ambulation with close CGA for carryover to natural environment 4x 86 ft ; cues for keeping feet apart and foot clearance.   Seated: Opposite UE/LE raise for reciprocating patterning 10x each LE  TherEx: cues for body mechanics and sequencing  Single leg heel raise 15x each LE, BUE support  modified lunges with SUE support 10x each LE; patient reports is medium  Seated: STS  10x tossing ball to PT at top of stand, two posterior LOB;  RTB hamstring curl 15x each LE small green ball between feet: LAQ with adduction 15x   Pt educated throughout session about proper posture and technique with exercises. Improved exercise technique, movement at target joints, use of target muscles after min to mod verbal, visual, tactile cues   Patient is highly motivated throughout physical therapy session. Ambulating with dual task of head turns is very challenging however patient is very unstable requiring assistance to remain upright multiple times. Patient is fatigued by end of  session requiring occasional rest breaks. Patient will benefit from skilled physical therapy to improve balance, gait mechanics, and overall mobility for decreased fall risk and improved quality of life                      PT Education - 07/15/20 0749     Education Details exercise technique, body mechanics    Person(s) Educated Patient    Methods Explanation;Demonstration;Tactile cues;Verbal cues    Comprehension Verbalized understanding;Returned demonstration;Verbal cues required;Tactile cues required              PT Short Term Goals - 06/26/20 1727       PT SHORT TERM GOAL #1   Title Patient will be independent in home exercise program to improve strength/mobility for better functional independence with ADLs    Baseline 6/22: HEP given    Time 4    Period Weeks    Status New    Target Date 07/24/20               PT Long Term Goals - 06/26/20 1728       PT LONG TERM GOAL #1   Title Patient will increase FOTO score to equal to or greater than  62%   to demonstrate statistically significant improvement in mobility and quality of life.    Baseline 6/22: 50%    Time 12    Period Weeks    Status New    Target Date 09/18/20      PT LONG TERM GOAL #2   Title Patient will increase Berg Balance score by > 6 points (42/56)  to demonstrate decreased fall risk during functional activities.    Baseline 6/22: 36/56    Time 12    Period Weeks    Status New    Target Date 09/18/20      PT LONG TERM GOAL #3   Title Patient will increase six minute walk test distance to >1500 without LOB for progression to age norm community ambulator and improve gait ability    Baseline 6/22: 1100 ft with frequent LOB and use of SPC    Time 12    Period Weeks    Status New    Target Date 09/18/20      PT LONG TERM GOAL #4   Title Patient will increase dynamic gait index score to >19/24 as to demonstrate reduced fall risk and improved dynamic gait balance for better  safety with community/home ambulation.    Baseline 6/22: 10/24 with cane    Time 12    Period Weeks    Status New    Target Date 09/18/20                   Plan - 07/15/20 0825     Clinical Impression Statement Patient is highly motivated throughout physical therapy session. Ambulating with dual task of head turns is  very challenging however patient is very unstable requiring assistance to remain upright multiple times. Patient is fatigued by end of session requiring occasional rest breaks. Patient will benefit from skilled physical therapy to improve balance, gait mechanics, and overall mobility for decreased fall risk and improved quality of life    Personal Factors and Comorbidities Age;Comorbidity 3+;Social Background;Time since onset of injury/illness/exacerbation;Fitness;Transportation    Comorbidities blindness in R eye, type II diabetes, HTN, HLD, stage 3a CKD, CHB, chronic renal failure, b12 deficiency, macular degeneration, hypercholesterolemia, PCO, cardiac pacemaker, PVCs, neuropathy    Examination-Activity Limitations Bed Mobility;Bend;Carry;Dressing;Reach Overhead;Locomotion Level;Lift;Squat;Stairs;Stand;Toileting;Transfers    Examination-Participation Restrictions Cleaning;Community Activity;Church;Driving;Meal Prep;Laundry;Shop;Volunteer;Yard Work    [employer] officer Evolving/Moderate complexity    Rehab Potential Good    PT Frequency 2x / week    PT Duration 12 weeks    PT Treatment/Interventions ADLs/Self Care Home Management;Biofeedback;Canalith Repostioning;Cryotherapy;Moist Heat;Traction;Ultrasound;Functional mobility training;Stair training;Gait training;DME Instruction;Therapeutic activities;Therapeutic exercise;Balance training;Neuromuscular re-education;Cognitive remediation;Manual techniques;Orthotic Fit/Training;Patient/family education;Passive range of motion;Dry needling;Vestibular;Taping;Energy conservation;Visual/perceptual  remediation/compensation    PT Next Visit Plan walk with walking sticks    PT Home Exercise Plan see above    Consulted and Agree with Plan of Care Patient             Patient will benefit from skilled therapeutic intervention in order to improve the following deficits and impairments:  Abnormal gait, Decreased activity tolerance, Decreased balance, Decreased endurance, Decreased coordination, Decreased knowledge of use of DME, Decreased mobility, Difficulty walking, Decreased strength, Increased edema, Impaired perceived functional ability, Impaired vision/preception, Postural dysfunction, Improper body mechanics  Visit Diagnosis: Unsteadiness on feet  Other abnormalities of gait and mobility     Problem List There are no problems to display for this patient.  Janna Arch, PT, DPT  07/15/2020, 8:43 AM  [place] MAIN [facility] SERVICES [address] [place], [place], [address] Phone: [phone]   Fax:  [phone]  Name: Laithan Conchas MRN: [id number] Date of Birth: [birth year]

## 2020-07-17 ENCOUNTER — Other Ambulatory Visit: Payer: Self-pay

## 2020-07-17 ENCOUNTER — Ambulatory Visit: Payer: Medicare Other

## 2020-07-17 DIAGNOSIS — R2681 Unsteadiness on feet: Secondary | ICD-10-CM

## 2020-07-17 DIAGNOSIS — R2689 Other abnormalities of gait and mobility: Secondary | ICD-10-CM

## 2020-07-17 NOTE — Therapy (Signed)
[place] MAIN [facility] SERVICES [address] [place], [place], [address] Phone: [phone]   Fax:  [phone]  Physical Therapy Treatment  Patient Details  Name: Charles Townsend MRN: [id number] Date of Birth: [birth year] Referring Provider (PT): Wonda Cerise   Encounter Date: 07/17/2020   PT End of Session - 07/17/20 0802     Visit Number 6    Number of Visits 24    Date for PT Re-Evaluation 09/18/20    Authorization Type 6/10 eval 06/26/20    PT Start Time 0800    PT Stop Time 0844    PT Time Calculation (min) 44 min    Equipment Utilized During Treatment Gait belt    Activity Tolerance Patient tolerated treatment well    Behavior During Therapy [facility] for tasks assessed/performed             History reviewed. No pertinent past medical history.  History reviewed. No pertinent surgical history.  There were no vitals filed for this visit.   Subjective Assessment - 07/17/20 0801     Subjective Patient reports he has been compliant with HEP. No falls or LOB since last session.    Pertinent History Patient is a pleasant 80 year old who presents for balance and walking. Reports it has began about a year ago and has gotten progressively worse.  When he tries to walk he falls .Patient uses a cane mostly, stumbles frequently and falls 3x/month, normally forward.  PMH includes blindness in R eye, type II diabetes, HTN, HLD, stage 3a CKD, CHB, chronic renal failure, b12 deficiency, macular degeneration, hypercholesterolemia, PCO, cardiac pacemaker, PVCs, neuropathy. CT was clear with no evidence of acute intracranial abnormalities.    Limitations Lifting;Standing;Walking;House hold activities    How long can you sit comfortably? n/a    How long can you stand comfortably? 30 mins    How long can you walk comfortably? as he walks he starts to drift    Patient Stated Goals increase his walking, decrease falls    Currently in Pain? No/denies                     Neuro Re-ed: close CGA with cues for safety awareness and body mechanics     Standing with CGA next to support surface:  airex pad: static stand: posterior trunk sway 30 seconds Airex pad: eyes closed 30 second x 2 trials; very challenging multiple near LOB Airex pad: NBOS 30 seconds, multiple near LOB. Airex pad: horizontal head turns 30 seconds scanning room 10x ; cueing for arc of motion ; frequent LOB to the L  Airex pad: vertical head turns 30 seconds, cueing for arc of motion, noticeable sway with upward gaze increasing demand on ankle righting reaction musculature  airex pad 6" step modified tandem stance 30 seconds x 2 trials Airex pad: hedgehog taps 10x each LE with SUE support   Single limb stance hold 30 seconds x 2 trials each LE  Airex pad: throw basketballs into hoop 20 balls; x2 trials  Tilt board:  -anterior posterior 10x each LE foot placement, cue for weight shift through LE's not Ue's -lateral right/left with BUE support 10x each side     TherEx: cues for body mechanics and sequencing Nustep Lvl 4 RPM> 60 for cardiovascular challenge, seat position 12, 4 minutes  10x STS with weighted ball press (2000 gr); no LOB  Weighted ball seated march 10x each LE with overhead raise.  Pt educated throughout session about proper posture and technique with exercises. Improved exercise technique, movement at target joints, use of target muscles after min to mod verbal, visual, tactile cues     Patient requires cueing for postural correction to reduce posterior LOB. He is highly motivated and shows carryover between sessions indicating compliance with HEP. His ankle righting reactions will continue to be an area of focus on stable and unstable surfaces. Patient will benefit from skilled physical therapy to improve balance, gait mechanics, and overall mobility for decreased fall risk and improved quality of life              PT Education -  07/17/20 0802     Education Details exercise technique, body mechanics    Person(s) Educated Patient    Methods Explanation;Demonstration;Tactile cues;Verbal cues    Comprehension Verbalized understanding;Returned demonstration;Verbal cues required;Tactile cues required              PT Short Term Goals - 06/26/20 1727       PT SHORT TERM GOAL #1   Title Patient will be independent in home exercise program to improve strength/mobility for better functional independence with ADLs    Baseline 6/22: HEP given    Time 4    Period Weeks    Status New    Target Date 07/24/20               PT Long Term Goals - 06/26/20 1728       PT LONG TERM GOAL #1   Title Patient will increase FOTO score to equal to or greater than  62%   to demonstrate statistically significant improvement in mobility and quality of life.    Baseline 6/22: 50%    Time 12    Period Weeks    Status New    Target Date 09/18/20      PT LONG TERM GOAL #2   Title Patient will increase Berg Balance score by > 6 points (42/56)  to demonstrate decreased fall risk during functional activities.    Baseline 6/22: 36/56    Time 12    Period Weeks    Status New    Target Date 09/18/20      PT LONG TERM GOAL #3   Title Patient will increase six minute walk test distance to >1500 without LOB for progression to age norm community ambulator and improve gait ability    Baseline 6/22: 1100 ft with frequent LOB and use of SPC    Time 12    Period Weeks    Status New    Target Date 09/18/20      PT LONG TERM GOAL #4   Title Patient will increase dynamic gait index score to >19/24 as to demonstrate reduced fall risk and improved dynamic gait balance for better safety with community/home ambulation.    Baseline 6/22: 10/24 with cane    Time 12    Period Weeks    Status New    Target Date 09/18/20                   Plan - 07/17/20 0823     Clinical Impression Statement Patient requires cueing for  postural correction to reduce posterior LOB. He is highly motivated and shows carryover between sessions indicating compliance with HEP. His ankle righting reactions will continue to be an area of focus on stable and unstable surfaces. Patient will benefit from skilled physical therapy to improve balance, gait mechanics, and  overall mobility for decreased fall risk and improved quality of life    Personal Factors and Comorbidities Age;Comorbidity 3+;Social Background;Time since onset of injury/illness/exacerbation;Fitness;Transportation    Comorbidities blindness in R eye, type II diabetes, HTN, HLD, stage 3a CKD, CHB, chronic renal failure, b12 deficiency, macular degeneration, hypercholesterolemia, PCO, cardiac pacemaker, PVCs, neuropathy    Examination-Activity Limitations Bed Mobility;Bend;Carry;Dressing;Reach Overhead;Locomotion Level;Lift;Squat;Stairs;Stand;Toileting;Transfers    Examination-Participation Restrictions Cleaning;Community Activity;Church;Driving;Meal Prep;Laundry;Shop;Volunteer;Yard Work    [employer] officer Evolving/Moderate complexity    Rehab Potential Good    PT Frequency 2x / week    PT Duration 12 weeks    PT Treatment/Interventions ADLs/Self Care Home Management;Biofeedback;Canalith Repostioning;Cryotherapy;Moist Heat;Traction;Ultrasound;Functional mobility training;Stair training;Gait training;DME Instruction;Therapeutic activities;Therapeutic exercise;Balance training;Neuromuscular re-education;Cognitive remediation;Manual techniques;Orthotic Fit/Training;Patient/family education;Passive range of motion;Dry needling;Vestibular;Taping;Energy conservation;Visual/perceptual remediation/compensation    PT Next Visit Plan walk with walking sticks    PT Home Exercise Plan see above    Consulted and Agree with Plan of Care Patient             Patient will benefit from skilled therapeutic intervention in order to improve the following deficits and  impairments:  Abnormal gait, Decreased activity tolerance, Decreased balance, Decreased endurance, Decreased coordination, Decreased knowledge of use of DME, Decreased mobility, Difficulty walking, Decreased strength, Increased edema, Impaired perceived functional ability, Impaired vision/preception, Postural dysfunction, Improper body mechanics  Visit Diagnosis: Unsteadiness on feet  Other abnormalities of gait and mobility     Problem List There are no problems to display for this patient.   Janna Arch, PT, DPT  07/17/2020, 8:44 AM  [place] MAIN [facility] SERVICES [address] [place], [place], [address] Phone: [phone]   Fax:  [phone]  Name: Qusay Villada MRN: [id number] Date of Birth: 1940-03-29

## 2020-07-22 ENCOUNTER — Other Ambulatory Visit: Payer: Self-pay

## 2020-07-22 ENCOUNTER — Ambulatory Visit: Payer: Medicare Other

## 2020-07-22 DIAGNOSIS — R2689 Other abnormalities of gait and mobility: Secondary | ICD-10-CM

## 2020-07-22 DIAGNOSIS — R2681 Unsteadiness on feet: Secondary | ICD-10-CM | POA: Diagnosis not present

## 2020-07-22 NOTE — Therapy (Signed)
[place] MAIN [facility] SERVICES [address] [place], [place], [address] Phone: [phone]   Fax:  [phone]  Physical Therapy Treatment  Patient Details  Name: Charles Townsend MRN: [id number] Date of Birth: 07-25-1940 Referring Provider (PT): Wonda Cerise   Encounter Date: 07/22/2020   PT End of Session - 07/22/20 0758     Visit Number 7    Number of Visits 24    Date for PT Re-Evaluation 09/18/20    Authorization Type 7/10 eval 06/26/20    PT Start Time 0759    PT Stop Time 0844    PT Time Calculation (min) 45 min    Equipment Utilized During Treatment Gait belt    Activity Tolerance Patient tolerated treatment well    Behavior During Therapy [facility] for tasks assessed/performed             History reviewed. No pertinent past medical history.  History reviewed. No pertinent surgical history.  There were no vitals filed for this visit.   Subjective Assessment - 07/22/20 0801     Subjective Patient reports he hasn't fallen. Walked in from the parking lot today for the first time. Is having a hard time keping his balance from falling backwards, especially at the sink    Pertinent History Patient is a pleasant 80 year old who presents for balance and walking. Reports it has began about a year ago and has gotten progressively worse.  When he tries to walk he falls .Patient uses a cane mostly, stumbles frequently and falls 3x/month, normally forward.  PMH includes blindness in R eye, type II diabetes, HTN, HLD, stage 3a CKD, CHB, chronic renal failure, b12 deficiency, macular degeneration, hypercholesterolemia, PCO, cardiac pacemaker, PVCs, neuropathy. CT was clear with no evidence of acute intracranial abnormalities.    Limitations Lifting;Standing;Walking;House hold activities    How long can you sit comfortably? n/a    How long can you stand comfortably? 30 mins    How long can you walk comfortably? as he walks he starts to drift     Patient Stated Goals increase his walking, decrease falls    Currently in Pain? No/denies               Neuro Re-ed: close CGA with cues for safety awareness and body mechanics     Standing with CGA next to support surface:  airex pad: static stand: posterior trunk sway 60 seconds Airex pad: eyes closed 30 second x 2 trials; very challenging multiple near LOB; cues for core contraction and plantarflexion  Airex pad: NBOS 30 seconds, multiple near LOB.  airex pad 6" step modified tandem stance 30 seconds x 2 trials Airex balance beam: lateral stepping 6x length of // bars very challenging for patient. Requires finger tip support frequent posterior LOB.  Airex balance beam: tandem walking with BUE support 8x length of // bars    Single limb stance hold 30 seconds x 2 trials each LE      Tilt board: -anterior posterior 10x each LE foot placement, cue for weight shift through LE's not Ue's -lateral right/left with BUE support 10x each side      TherEx: cues for body mechanics and sequencing Nustep Lvl 3-4 RPM> 60 for cardiovascular challenge, seat position 12, 4 minutes Walking lunges 2x length of // bars; cue for slight lean forward due to posterior tilt.   10x STS with weighted ball press (2000 gr); no LOB Weighted ball standing march 10x each LE  with overhead raise.      Pt educated throughout session about proper posture and technique with exercises. Improved exercise technique, movement at target joints, use of target muscles after min to mod verbal, visual, tactile cues   Patient is highly motivated throughout physical therapy session. Continued education on postural awareness with stabilization in standing. Ankle righting reactions to reduce posterior LOB continues to be focus at this time. Patient will benefit from skilled physical therapy to improve balance, gait mechanics, and overall mobility for decreased fall risk and improved quality of  life                     PT Education - 07/22/20 0758     Education Details exercise technique, body mechanics    Person(s) Educated Patient    Methods Explanation;Demonstration;Tactile cues;Verbal cues    Comprehension Verbalized understanding;Returned demonstration;Verbal cues required;Tactile cues required              PT Short Term Goals - 06/26/20 1727       PT SHORT TERM GOAL #1   Title Patient will be independent in home exercise program to improve strength/mobility for better functional independence with ADLs    Baseline 6/22: HEP given    Time 4    Period Weeks    Status New    Target Date 07/24/20               PT Long Term Goals - 06/26/20 1728       PT LONG TERM GOAL #1   Title Patient will increase FOTO score to equal to or greater than  62%   to demonstrate statistically significant improvement in mobility and quality of life.    Baseline 6/22: 50%    Time 12    Period Weeks    Status New    Target Date 09/18/20      PT LONG TERM GOAL #2   Title Patient will increase Berg Balance score by > 6 points (42/56)  to demonstrate decreased fall risk during functional activities.    Baseline 6/22: 36/56    Time 12    Period Weeks    Status New    Target Date 09/18/20      PT LONG TERM GOAL #3   Title Patient will increase six minute walk test distance to >1500 without LOB for progression to age norm community ambulator and improve gait ability    Baseline 6/22: 1100 ft with frequent LOB and use of SPC    Time 12    Period Weeks    Status New    Target Date 09/18/20      PT LONG TERM GOAL #4   Title Patient will increase dynamic gait index score to >19/24 as to demonstrate reduced fall risk and improved dynamic gait balance for better safety with community/home ambulation.    Baseline 6/22: 10/24 with cane    Time 12    Period Weeks    Status New    Target Date 09/18/20                   Plan - 07/22/20 0827      Clinical Impression Statement Patient is highly motivated throughout physical therapy session. Continued education on postural awareness with stabilization in standing. Ankle righting reactions to reduce posterior LOB continues to be focus at this time. Patient will benefit from skilled physical therapy to improve balance, gait mechanics, and overall mobility for decreased fall  risk and improved quality of life    Personal Factors and Comorbidities Age;Comorbidity 3+;Social Background;Time since onset of injury/illness/exacerbation;Fitness;Transportation    Comorbidities blindness in R eye, type II diabetes, HTN, HLD, stage 3a CKD, CHB, chronic renal failure, b12 deficiency, macular degeneration, hypercholesterolemia, PCO, cardiac pacemaker, PVCs, neuropathy    Examination-Activity Limitations Bed Mobility;Bend;Carry;Dressing;Reach Overhead;Locomotion Level;Lift;Squat;Stairs;Stand;Toileting;Transfers    Examination-Participation Restrictions Cleaning;Community Activity;Church;Driving;Meal Prep;Laundry;Shop;Volunteer;Yard Work    [employer] officer Evolving/Moderate complexity    Rehab Potential Good    PT Frequency 2x / week    PT Duration 12 weeks    PT Treatment/Interventions ADLs/Self Care Home Management;Biofeedback;Canalith Repostioning;Cryotherapy;Moist Heat;Traction;Ultrasound;Functional mobility training;Stair training;Gait training;DME Instruction;Therapeutic activities;Therapeutic exercise;Balance training;Neuromuscular re-education;Cognitive remediation;Manual techniques;Orthotic Fit/Training;Patient/family education;Passive range of motion;Dry needling;Vestibular;Taping;Energy conservation;Visual/perceptual remediation/compensation    PT Next Visit Plan walk with walking sticks    PT Home Exercise Plan see above    Consulted and Agree with Plan of Care Patient             Patient will benefit from skilled therapeutic intervention in order to improve the following  deficits and impairments:  Abnormal gait, Decreased activity tolerance, Decreased balance, Decreased endurance, Decreased coordination, Decreased knowledge of use of DME, Decreased mobility, Difficulty walking, Decreased strength, Increased edema, Impaired perceived functional ability, Impaired vision/preception, Postural dysfunction, Improper body mechanics  Visit Diagnosis: Unsteadiness on feet  Other abnormalities of gait and mobility     Problem List There are no problems to display for this patient.   Janna Arch, PT, DPT  07/22/2020, 8:44 AM  [place] MAIN [facility] SERVICES [address] [place], [place], [address] Phone: [phone]   Fax:  [phone]  Name: Charles Townsend MRN: [id number] Date of Birth: Jun 16, 1940

## 2020-07-24 ENCOUNTER — Other Ambulatory Visit: Payer: Self-pay

## 2020-07-24 ENCOUNTER — Ambulatory Visit: Payer: Medicare Other

## 2020-07-24 DIAGNOSIS — R2681 Unsteadiness on feet: Secondary | ICD-10-CM | POA: Diagnosis not present

## 2020-07-24 DIAGNOSIS — R2689 Other abnormalities of gait and mobility: Secondary | ICD-10-CM

## 2020-07-24 NOTE — Therapy (Signed)
[place] MAIN [facility] SERVICES [address] [place], [place], [address] Phone: [phone]   Fax:  [phone]  Physical Therapy Treatment  Patient Details  Name: Charles Townsend MRN: [id number] Date of Birth: [birth year] Referring Provider (PT): Wonda Cerise   Encounter Date: 07/24/2020   PT End of Session - 07/24/20 0752     Visit Number 8    Number of Visits 24    Date for PT Re-Evaluation 09/18/20    Authorization Type 8/10 eval 06/26/20    PT Start Time 0759    PT Stop Time 0844    PT Time Calculation (min) 45 min    Equipment Utilized During Treatment Gait belt    Activity Tolerance Patient tolerated treatment well    Behavior During Therapy [facility] for tasks assessed/performed             History reviewed. No pertinent past medical history.  History reviewed. No pertinent surgical history.  There were no vitals filed for this visit.   Subjective Assessment - 07/24/20 0800     Subjective Patient reports he has not had any falls or LOB since last session. Came with his wife today.    Pertinent History Patient is a pleasant 80 year old who presents for balance and walking. Reports it has began about a year ago and has gotten progressively worse.  When he tries to walk he falls .Patient uses a cane mostly, stumbles frequently and falls 3x/month, normally forward.  PMH includes blindness in R eye, type II diabetes, HTN, HLD, stage 3a CKD, CHB, chronic renal failure, b12 deficiency, macular degeneration, hypercholesterolemia, PCO, cardiac pacemaker, PVCs, neuropathy. CT was clear with no evidence of acute intracranial abnormalities.    Limitations Lifting;Standing;Walking;House hold activities    How long can you sit comfortably? n/a    How long can you stand comfortably? 30 mins    How long can you walk comfortably? as he walks he starts to drift    Patient Stated Goals increase his walking, decrease falls    Currently in Pain?  No/denies                   Neuro Re-ed: close CGA with cues for safety awareness and body mechanics Airex pad: saebo ball transfer x 6 minutes for reaching in static position inside and outside BOS with BUE  Airex pad 6" step modified tandem stance 30 seconds x 2 trials Airex pad 6" step sandwhich: lateral stepping up/down 10x each direction, more challenging stepping to the right. Frequent LOB requiring assistance to return to upright position.  Airex balance beam: lateral stepping 6x length of // bars very challenging for patient. Requires finger tip support frequent posterior LOB. Airex balance beam: tandem walking with BUE support 8x length of // bars  Single limb stance hold 30 seconds x 2 trials each LE   ambulate in hallway: -horizontal head turns with cues for reading alphabet from cards 86 ftx 2 sets; frequent near LOB requiring PT assistance to remain upright  -horizontal head turns with cues for reading number and symbols from cards 86 ft x 2 sets frequent near LOB requiring PT assistance to remain upright   [place]: challenging for patient to perform requiring max cueing for sequencing and demonstration 8x length of // bars    TherEx: cues for body mechanics and sequencing Nustep Lvl 4 RPM> 60 for cardiovascular challenge, seat position 12, 4 minutes for cardiovascular challenge.  Lateral stepping with RTB  around ankles 4x length of // bars.  10x STS with weighted ball press (2000 gr); no LOB GTB row 15x       Pt educated throughout session about proper posture and technique with exercises. Improved exercise technique, movement at target joints, use of target muscles after min to mod verbal, visual, tactile cues   Patient presents to physical therapy with excellent motivation. He does have increased sway this session requiring additional cueing and close CGA for safety and stabilization. Reaching outside of BOS performed on unstable surface as well as stepping  up/down to challenge ankle righting reactions. Patient will benefit from skilled physical therapy to improve balance, gait mechanics, and overall mobility for decreased fall risk and improved quality of life                 PT Education - 07/24/20 0752     Education Details exercise technique, body mechanics    Person(s) Educated Patient    Methods Explanation;Demonstration;Tactile cues;Verbal cues    Comprehension Verbalized understanding;Returned demonstration;Verbal cues required;Tactile cues required              PT Short Term Goals - 06/26/20 1727       PT SHORT TERM GOAL #1   Title Patient will be independent in home exercise program to improve strength/mobility for better functional independence with ADLs    Baseline 6/22: HEP given    Time 4    Period Weeks    Status New    Target Date 07/24/20               PT Long Term Goals - 06/26/20 1728       PT LONG TERM GOAL #1   Title Patient will increase FOTO score to equal to or greater than  62%   to demonstrate statistically significant improvement in mobility and quality of life.    Baseline 6/22: 50%    Time 12    Period Weeks    Status New    Target Date 09/18/20      PT LONG TERM GOAL #2   Title Patient will increase Berg Balance score by > 6 points (42/56)  to demonstrate decreased fall risk during functional activities.    Baseline 6/22: 36/56    Time 12    Period Weeks    Status New    Target Date 09/18/20      PT LONG TERM GOAL #3   Title Patient will increase six minute walk test distance to >1500 without LOB for progression to age norm community ambulator and improve gait ability    Baseline 6/22: 1100 ft with frequent LOB and use of SPC    Time 12    Period Weeks    Status New    Target Date 09/18/20      PT LONG TERM GOAL #4   Title Patient will increase dynamic gait index score to >19/24 as to demonstrate reduced fall risk and improved dynamic gait balance for better safety  with community/home ambulation.    Baseline 6/22: 10/24 with cane    Time 12    Period Weeks    Status New    Target Date 09/18/20                   Plan - 07/24/20 0824     Clinical Impression Statement Patient presents to physical therapy with excellent motivation. He does have increased sway this session requiring additional cueing and close CGA for  safety and stabilization. Reaching outside of BOS performed on unstable surface as well as stepping up/down to challenge ankle righting reactions. Patient will benefit from skilled physical therapy to improve balance, gait mechanics, and overall mobility for decreased fall risk and improved quality of life    Personal Factors and Comorbidities Age;Comorbidity 3+;Social Background;Time since onset of injury/illness/exacerbation;Fitness;Transportation    Comorbidities blindness in R eye, type II diabetes, HTN, HLD, stage 3a CKD, CHB, chronic renal failure, b12 deficiency, macular degeneration, hypercholesterolemia, PCO, cardiac pacemaker, PVCs, neuropathy    Examination-Activity Limitations Bed Mobility;Bend;Carry;Dressing;Reach Overhead;Locomotion Level;Lift;Squat;Stairs;Stand;Toileting;Transfers    Examination-Participation Restrictions Cleaning;Community Activity;Church;Driving;Meal Prep;Laundry;Shop;Volunteer;Yard Work    [employer] officer Evolving/Moderate complexity    Rehab Potential Good    PT Frequency 2x / week    PT Duration 12 weeks    PT Treatment/Interventions ADLs/Self Care Home Management;Biofeedback;Canalith Repostioning;Cryotherapy;Moist Heat;Traction;Ultrasound;Functional mobility training;Stair training;Gait training;DME Instruction;Therapeutic activities;Therapeutic exercise;Balance training;Neuromuscular re-education;Cognitive remediation;Manual techniques;Orthotic Fit/Training;Patient/family education;Passive range of motion;Dry needling;Vestibular;Taping;Energy conservation;Visual/perceptual  remediation/compensation    PT Next Visit Plan walk with walking sticks    PT Home Exercise Plan see above    Consulted and Agree with Plan of Care Patient             Patient will benefit from skilled therapeutic intervention in order to improve the following deficits and impairments:  Abnormal gait, Decreased activity tolerance, Decreased balance, Decreased endurance, Decreased coordination, Decreased knowledge of use of DME, Decreased mobility, Difficulty walking, Decreased strength, Increased edema, Impaired perceived functional ability, Impaired vision/preception, Postural dysfunction, Improper body mechanics  Visit Diagnosis: Unsteadiness on feet  Other abnormalities of gait and mobility     Problem List There are no problems to display for this patient.  Janna Arch, PT, DPT  07/24/2020, 8:46 AM  [place] MAIN [facility] SERVICES [address] [place], [place], [address] Phone: [phone]   Fax:  [phone]  Name: Charles Townsend MRN: [id number] Date of Birth: Dec 31, 1940

## 2020-07-26 ENCOUNTER — Ambulatory Visit: Payer: Medicare Other

## 2020-07-29 ENCOUNTER — Other Ambulatory Visit: Payer: Self-pay

## 2020-07-29 ENCOUNTER — Ambulatory Visit: Payer: Medicare Other | Admitting: Physical Therapy

## 2020-07-29 DIAGNOSIS — R2681 Unsteadiness on feet: Secondary | ICD-10-CM | POA: Diagnosis not present

## 2020-07-29 DIAGNOSIS — R2689 Other abnormalities of gait and mobility: Secondary | ICD-10-CM

## 2020-07-29 NOTE — Therapy (Signed)
[place] MAIN [facility] SERVICES [address] [place], [place], [address] Phone: [phone]   Fax:  [phone]  Physical Therapy Treatment  Patient Details  Name: Charles Townsend MRN: [id number] Date of Birth: June 06, 1940 Referring Provider (PT): Wonda Cerise   Encounter Date: 07/29/2020   PT End of Session - 07/29/20 1241     Visit Number 9    Number of Visits 24    Date for PT Re-Evaluation 09/18/20    Authorization Type 8/10 eval 06/26/20    PT Start Time 0800    PT Stop Time 0845    PT Time Calculation (min) 45 min    Equipment Utilized During Treatment Gait belt    Activity Tolerance Patient tolerated treatment well    Behavior During Therapy [facility] for tasks assessed/performed             History reviewed. No pertinent past medical history.  History reviewed. No pertinent surgical history.  There were no vitals filed for this visit.   Subjective Assessment - 07/29/20 0804     Subjective Patient reports he has not had any falls or LOB since last session. Denies pain. States he is doing well today.    Pertinent History Patient is a pleasant 79 year old who presents for balance and walking. Reports it has began about a year ago and has gotten progressively worse.  When he tries to walk he falls .Patient uses a cane mostly, stumbles frequently and falls 3x/month, normally forward.  PMH includes blindness in R eye, type II diabetes, HTN, HLD, stage 3a CKD, CHB, chronic renal failure, b12 deficiency, macular degeneration, hypercholesterolemia, PCO, cardiac pacemaker, PVCs, neuropathy. CT was clear with no evidence of acute intracranial abnormalities.    Limitations Lifting;Standing;Walking;House hold activities    How long can you sit comfortably? n/a    How long can you stand comfortably? 30 mins    How long can you walk comfortably? as he walks he starts to drift    Patient Stated Goals increase his walking, decrease falls                 Neuro Re-ed: close CGA with cues for safety awareness and body mechanics Tandem stance (flat ground) 3 x 10-30 seconds BLE; Airex pad tandem stance 5-20 seconds x 5 trials BLE; Airex pad 6" step sandwhich: lateral stepping up/down 2x10 each direction, more challenging stepping to the right. Frequent LOB requiring assistance to return to upright position.  Toe taps to cone, LLE standing on Airex pad w/ RLE tapping and RLE standing on flat ground w/ LLE tapping x10 taps BLE; alternating toe taps x 20 reps from flat ground; (increased difficulty standing on RLE);    ambulate in hallway: -horizontal head turns with cues for reading alphabet from cards 86 ftx 2 sets; frequent near LOB requiring PT assistance to remain upright  -horizontal head turns with cues for reading number and symbols from cards 86 ft x 2 sets frequent near LOB requiring PT assistance to remain upright -ambulation w/ dual tasking (conversation); LOB on multiple occasions requiring PT assistance to remain upright.     TherEx: cues for body mechanics and sequencing Lateral stepping with RTB around ankles 2 x 20 steps each direction.  STS with weighted ball at chest (5000 gr) 2 x 10; no LOB       Pt educated throughout session about proper posture and technique with exercises. Improved exercise technique, movement at target joints, use of  target muscles after min to mod verbal, visual, tactile cues     Patient presents to physical therapy with excellent motivation. Increased lateral LOB during stability exercises as well as usual posterior LOB. MIN to MOD A required on multiple occasions to correct LOB; pt also uses BUE on support bar to correct LOB. Stability was progressed/challenged with single leg stance exercises. Pt presented with increased lateral sway and drifting of gait during ambulation in both directions during head turns and during dual tasking (conversation). Patient will benefit from skilled  physical therapy to improve balance, gait mechanics, and overall mobility for decreased fall risk and improved quality of life.        PT Short Term Goals - 06/26/20 1727       PT SHORT TERM GOAL #1   Title Patient will be independent in home exercise program to improve strength/mobility for better functional independence with ADLs    Baseline 6/22: HEP given    Time 4    Period Weeks    Status New    Target Date 07/24/20               PT Long Term Goals - 06/26/20 1728       PT LONG TERM GOAL #1   Title Patient will increase FOTO score to equal to or greater than  62%   to demonstrate statistically significant improvement in mobility and quality of life.    Baseline 6/22: 50%    Time 12    Period Weeks    Status New    Target Date 09/18/20      PT LONG TERM GOAL #2   Title Patient will increase Berg Balance score by > 6 points (42/56)  to demonstrate decreased fall risk during functional activities.    Baseline 6/22: 36/56    Time 12    Period Weeks    Status New    Target Date 09/18/20      PT LONG TERM GOAL #3   Title Patient will increase six minute walk test distance to >1500 without LOB for progression to age norm community ambulator and improve gait ability    Baseline 6/22: 1100 ft with frequent LOB and use of SPC    Time 12    Period Weeks    Status New    Target Date 09/18/20      PT LONG TERM GOAL #4   Title Patient will increase dynamic gait index score to >19/24 as to demonstrate reduced fall risk and improved dynamic gait balance for better safety with community/home ambulation.    Baseline 6/22: 10/24 with cane    Time 12    Period Weeks    Status New    Target Date 09/18/20                   Plan - 07/29/20 1241     Clinical Impression Statement Patient presents to physical therapy with excellent motivation. Increased lateral LOB during stability exercises as well as usual posterior LOB. MIN to MOD A required on multiple  occasions to correct LOB; pt also uses BUE on support bar to correct LOB. Stability was progressed/challenged with single leg stance exercises. Pt presented with increased lateral sway and drifting of gait during ambulation in both directions during head turns and during dual tasking (conversation). Patient will benefit from skilled physical therapy to improve balance, gait mechanics, and overall mobility for decreased fall risk and improved quality of life.  Personal Factors and Comorbidities Age;Comorbidity 3+;Social Background;Time since onset of injury/illness/exacerbation;Fitness;Transportation    Comorbidities blindness in R eye, type II diabetes, HTN, HLD, stage 3a CKD, CHB, chronic renal failure, b12 deficiency, macular degeneration, hypercholesterolemia, PCO, cardiac pacemaker, PVCs, neuropathy    Examination-Activity Limitations Bed Mobility;Bend;Carry;Dressing;Reach Overhead;Locomotion Level;Lift;Squat;Stairs;Stand;Toileting;Transfers    Examination-Participation Restrictions Cleaning;Community Activity;Church;Driving;Meal Prep;Laundry;Shop;Volunteer;Yard Work    [employer] officer Evolving/Moderate complexity    Rehab Potential Good    PT Frequency 2x / week    PT Duration 12 weeks    PT Treatment/Interventions ADLs/Self Care Home Management;Biofeedback;Canalith Repostioning;Cryotherapy;Moist Heat;Traction;Ultrasound;Functional mobility training;Stair training;Gait training;DME Instruction;Therapeutic activities;Therapeutic exercise;Balance training;Neuromuscular re-education;Cognitive remediation;Manual techniques;Orthotic Fit/Training;Patient/family education;Passive range of motion;Dry needling;Vestibular;Taping;Energy conservation;Visual/perceptual remediation/compensation    PT Next Visit Plan walk with walking sticks    PT Home Exercise Plan see above    Consulted and Agree with Plan of Care Patient             Patient will benefit from skilled therapeutic  intervention in order to improve the following deficits and impairments:  Abnormal gait, Decreased activity tolerance, Decreased balance, Decreased endurance, Decreased coordination, Decreased knowledge of use of DME, Decreased mobility, Difficulty walking, Decreased strength, Increased edema, Impaired perceived functional ability, Impaired vision/preception, Postural dysfunction, Improper body mechanics  Visit Diagnosis: Unsteadiness on feet  Other abnormalities of gait and mobility     Problem List There are no problems to display for this patient.  Patrina Levering PT, DPT  [facility] 07/29/2020, 12:53 PM  [place] MAIN [facility] SERVICES [address] [place], [place], [address] Phone: [phone]   Fax:  [phone]  Name: Taegan Bodiford MRN: [id number] Date of Birth: May 02, 1940

## 2020-07-31 ENCOUNTER — Ambulatory Visit: Payer: Medicare Other

## 2020-07-31 ENCOUNTER — Other Ambulatory Visit: Payer: Self-pay

## 2020-07-31 DIAGNOSIS — R2689 Other abnormalities of gait and mobility: Secondary | ICD-10-CM

## 2020-07-31 DIAGNOSIS — R2681 Unsteadiness on feet: Secondary | ICD-10-CM

## 2020-07-31 NOTE — Therapy (Signed)
[place] MAIN [facility] SERVICES [address] [place], [place], [address] Phone: [phone]   Fax:  [phone]  Physical Therapy Treatment Physical Therapy Progress Note   Dates of reporting period  06/26/20   to   07/31/20   Patient Details  Name: Charles Townsend MRN: [id number] Date of Birth: [birth year] Referring Provider (PT): Wonda Cerise   Encounter Date: 07/31/2020   PT End of Session - 07/31/20 0821     Visit Number 10    Number of Visits 24    Date for PT Re-Evaluation 09/18/20    Authorization Type 10/10 eval 06/26/20; next session 1/10 PN 07/31/20    PT Start Time 0801    PT Stop Time 0844    PT Time Calculation (min) 43 min    Equipment Utilized During Treatment Gait belt    Activity Tolerance Patient tolerated treatment well    Behavior During Therapy [facility] for tasks assessed/performed             History reviewed. No pertinent past medical history.  History reviewed. No pertinent surgical history.  There were no vitals filed for this visit.   Subjective Assessment - 07/31/20 0806     Subjective Patient reports no falls or LOB since last session. Feels more unsteady this morning, woke up feeling this morning off balance.    Pertinent History Patient is a pleasant 80 year old who presents for balance and walking. Reports it has began about a year ago and has gotten progressively worse.  When he tries to walk he falls .Patient uses a cane mostly, stumbles frequently and falls 3x/month, normally forward.  PMH includes blindness in R eye, type II diabetes, HTN, HLD, stage 3a CKD, CHB, chronic renal failure, b12 deficiency, macular degeneration, hypercholesterolemia, PCO, cardiac pacemaker, PVCs, neuropathy. CT was clear with no evidence of acute intracranial abnormalities.    Limitations Lifting;Standing;Walking;House hold activities    How long can you sit comfortably? n/a    How long can you stand comfortably? 30 mins    How  long can you walk comfortably? as he walks he starts to drift    Patient Stated Goals increase his walking, decrease falls    Currently in Pain? No/denies                [facility] PT Assessment - 07/31/20 0001       Standardized Balance Assessment   Standardized Balance Assessment Berg Balance Test;Dynamic Gait Index      Berg Balance Test   Sit to Stand Able to stand without using hands and stabilize independently    Standing Unsupported Able to stand 2 minutes with supervision    Sitting with Back Unsupported but Feet Supported on Floor or Stool Able to sit safely and securely 2 minutes    Stand to Sit Sits safely with minimal use of hands    Transfers Able to transfer safely, minor use of hands    Standing Unsupported with Eyes Closed Able to stand 10 seconds with supervision    Standing Unsupported with Feet Together Able to place feet together independently and stand for 1 minute with supervision    From Standing, Reach Forward with Outstretched Arm Can reach forward >5 cm safely (2")    From Standing Position, Pick up Object from [place] to pick up shoe, needs supervision    From Standing Position, Turn to Look Behind Over each Shoulder Looks behind from both sides and weight shifts well  Turn 360 Degrees Able to turn 360 degrees safely one side only in 4 seconds or less    Standing Unsupported, Alternately Place Feet on Step/Stool Able to stand independently and complete 8 steps >20 seconds    Standing Unsupported, One Foot in Front Able to take small step independently and hold 30 seconds    Standing on One Leg Able to lift leg independently and hold 5-10 seconds    Total Score 45      Dynamic Gait Index   Level Surface Mild Impairment    Change in Gait Speed Mild Impairment    Gait with Horizontal Head Turns Moderate Impairment    Gait with Vertical Head Turns Moderate Impairment    Gait and Pivot Turn Mild Impairment    Step Over Obstacle Moderate Impairment     Step Around Obstacles Mild Impairment    Steps Mild Impairment    Total Score 13             GOALS:  FOTO: 57%  BERG: 45/ 56  6 min walk test: 935 ft with SPC, multiple near LOB  DGI: 13/24     Treatment:  Nustp Lvl 4 , seat position 11 RPM> 60 for cardiovascular challenge 4 minutes   In // bars: SLS  : LLE 7 seconds RLE 9 seconds  airex balance beam: lateral stepping 6x length, frequent posterior LOB requiring cue for plantarflexion and core activation with mod A to stabilize patient.       Patient's condition has the potential to improve in response to therapy. Maximum improvement is yet to be obtained. The anticipated improvement is attainable and reasonable in a generally predictable time.  Patient reports he is still unsteady and bumps into things a lot but no falls.   Patient's goals assessed this session with progress in stability noted with BERG and DGI. He is highly motivated for goal progression to reduce episodes of instability. Lateral sway and drifting of gait with ambulation during dual task noted. Patient's condition has the potential to improve in response to therapy. Maximum improvement is yet to be obtained. The anticipated improvement is attainable and reasonable in a generally predictable time. Patient will benefit from skilled physical therapy to improve balance, gait mechanics, and overall mobility for decreased fall risk and improved quality of life      PT Education - 07/31/20 0807     Education Details exercise technique, body mechanics    Person(s) Educated Patient    Methods Explanation;Demonstration;Tactile cues;Verbal cues    Comprehension Verbalized understanding;Returned demonstration;Verbal cues required;Tactile cues required              PT Short Term Goals - 07/31/20 0831       PT SHORT TERM GOAL #1   Title Patient will be independent in home exercise program to improve strength/mobility for better functional independence with  ADLs    Baseline 6/22: HEP given 7/27: HEP compliant    Time 4    Period Weeks    Status Achieved    Target Date 07/24/20               PT Long Term Goals - 07/31/20 5465       PT LONG TERM GOAL #1   Title Patient will increase FOTO score to equal to or greater than  62%   to demonstrate statistically significant improvement in mobility and quality of life.    Baseline 6/22: 50% 7/27: 57%    Time 12  Period Weeks    Status Partially Met    Target Date 09/18/20      PT LONG TERM GOAL #2   Title Patient will increase Berg Balance score by > 6 points (42/56)  to demonstrate decreased fall risk during functional activities.    Baseline 6/22: 36/56 7/27: 45/56    Time 12    Period Weeks    Status Achieved      PT LONG TERM GOAL #3   Title Patient will increase six minute walk test distance to >1500 without LOB for progression to age norm community ambulator and improve gait ability    Baseline 6/22: 1100 ft with frequent LOB and use of SPC 7/27: 935 ft with SPC and multiple near LOB    Time 12    Period Weeks    Status On-going    Target Date 09/18/20      PT LONG TERM GOAL #4   Title Patient will increase dynamic gait index score to >19/24 as to demonstrate reduced fall risk and improved dynamic gait balance for better safety with community/home ambulation.    Baseline 6/22: 10/24 with cane 7/27: 13/24    Time 12    Period Weeks    Status Partially Met    Target Date 09/18/20                   Plan - 07/31/20 0834     Clinical Impression Statement Patient's goals assessed this session with progress in stability noted with BERG and DGI. He is highly motivated for goal progression to reduce episodes of instability. Lateral sway and drifting of gait with ambulation during dual task noted. Patient's condition has the potential to improve in response to therapy. Maximum improvement is yet to be obtained. The anticipated improvement is attainable and reasonable in  a generally predictable time. Patient will benefit from skilled physical therapy to improve balance, gait mechanics, and overall mobility for decreased fall risk and improved quality of life    Personal Factors and Comorbidities Age;Comorbidity 3+;Social Background;Time since onset of injury/illness/exacerbation;Fitness;Transportation    Comorbidities blindness in R eye, type II diabetes, HTN, HLD, stage 3a CKD, CHB, chronic renal failure, b12 deficiency, macular degeneration, hypercholesterolemia, PCO, cardiac pacemaker, PVCs, neuropathy    Examination-Activity Limitations Bed Mobility;Bend;Carry;Dressing;Reach Overhead;Locomotion Level;Lift;Squat;Stairs;Stand;Toileting;Transfers    Examination-Participation Restrictions Cleaning;Community Activity;Church;Driving;Meal Prep;Laundry;Shop;Volunteer;Yard Work    [employer] officer Evolving/Moderate complexity    Rehab Potential Good    PT Frequency 2x / week    PT Duration 12 weeks    PT Treatment/Interventions ADLs/Self Care Home Management;Biofeedback;Canalith Repostioning;Cryotherapy;Moist Heat;Traction;Ultrasound;Functional mobility training;Stair training;Gait training;DME Instruction;Therapeutic activities;Therapeutic exercise;Balance training;Neuromuscular re-education;Cognitive remediation;Manual techniques;Orthotic Fit/Training;Patient/family education;Passive range of motion;Dry needling;Vestibular;Taping;Energy conservation;Visual/perceptual remediation/compensation    PT Next Visit Plan walk with walking sticks    PT Home Exercise Plan see above    Consulted and Agree with Plan of Care Patient             Patient will benefit from skilled therapeutic intervention in order to improve the following deficits and impairments:  Abnormal gait, Decreased activity tolerance, Decreased balance, Decreased endurance, Decreased coordination, Decreased knowledge of use of DME, Decreased mobility, Difficulty walking, Decreased  strength, Increased edema, Impaired perceived functional ability, Impaired vision/preception, Postural dysfunction, Improper body mechanics  Visit Diagnosis: Unsteadiness on feet  Other abnormalities of gait and mobility     Problem List There are no problems to display for this patient.   Janna Arch, PT, DPT  07/31/2020, 12:28 PM  [place] MAIN [facility] SERVICES [address] [place], [place], [address] Phone: [phone]   Fax:  [phone]  Name: Charles Townsend MRN: [id number] Date of Birth: 21-Sep-1940

## 2020-08-05 ENCOUNTER — Ambulatory Visit: Payer: Medicare Other | Attending: Physician Assistant

## 2020-08-05 ENCOUNTER — Other Ambulatory Visit: Payer: Self-pay

## 2020-08-05 DIAGNOSIS — R262 Difficulty in walking, not elsewhere classified: Secondary | ICD-10-CM | POA: Insufficient documentation

## 2020-08-05 DIAGNOSIS — R269 Unspecified abnormalities of gait and mobility: Secondary | ICD-10-CM | POA: Insufficient documentation

## 2020-08-05 DIAGNOSIS — R2689 Other abnormalities of gait and mobility: Secondary | ICD-10-CM | POA: Diagnosis present

## 2020-08-05 DIAGNOSIS — R2681 Unsteadiness on feet: Secondary | ICD-10-CM | POA: Insufficient documentation

## 2020-08-05 DIAGNOSIS — R278 Other lack of coordination: Secondary | ICD-10-CM | POA: Diagnosis present

## 2020-08-05 DIAGNOSIS — M6281 Muscle weakness (generalized): Secondary | ICD-10-CM | POA: Insufficient documentation

## 2020-08-05 NOTE — Therapy (Signed)
[place] MAIN [facility] SERVICES [address] [place], [place], [address] Phone: [phone]   Fax:  [phone]  Physical Therapy Treatment  Patient Details  Name: Charles Townsend MRN: [id number] Date of Birth: [birth year] Referring Provider (PT): Wonda Cerise   Encounter Date: 08/05/2020   PT End of Session - 08/05/20 0803     Visit Number 11    Number of Visits 24    Date for PT Re-Evaluation 09/18/20    Authorization Type 1/10 PN 07/31/20    PT Start Time 0800    PT Stop Time 0844    PT Time Calculation (min) 44 min    Equipment Utilized During Treatment Gait belt    Activity Tolerance Patient tolerated treatment well    Behavior During Therapy [facility] for tasks assessed/performed             History reviewed. No pertinent past medical history.  History reviewed. No pertinent surgical history.  There were no vitals filed for this visit.   Subjective Assessment - 08/05/20 0802     Subjective Patient reports he continues to have difficulty with his balance. Walking and bending down are challenging for patient still.    Pertinent History Patient is a pleasant 80 year old who presents for balance and walking. Reports it has began about a year ago and has gotten progressively worse.  When he tries to walk he falls .Patient uses a cane mostly, stumbles frequently and falls 3x/month, normally forward.  PMH includes blindness in R eye, type II diabetes, HTN, HLD, stage 3a CKD, CHB, chronic renal failure, b12 deficiency, macular degeneration, hypercholesterolemia, PCO, cardiac pacemaker, PVCs, neuropathy. CT was clear with no evidence of acute intracranial abnormalities.    Limitations Lifting;Standing;Walking;House hold activities    How long can you sit comfortably? n/a    How long can you stand comfortably? 30 mins    How long can you walk comfortably? as he walks he starts to drift    Patient Stated Goals increase his walking, decrease falls     Currently in Pain? No/denies                 Neuro Re-ed: close CGA with cues for safety awareness and body mechanics Airex pad 6" step modified tandem stance 30 seconds x 2 trials Airex pad 6" step sandwhich: lateral stepping up/down 10x each direction, more challenging stepping to the right. Frequent LOB requiring assistance to return to upright position.  Airex pad: PVC pipe: chest press 10x, straight arm raise 10x, twist 10x each direction Single limb stance hold 30 seconds x 2 trials each LE  Bend down and pick up cone and pass to PT, return and put back on the ground. Speed ladder 8x length of // bars with SUE support one foot each square for neutral gait mechanics.   ambulate in hallway:cue for sequencing and keeping feet apart, frequent LOB requiring PT assistance to remain upright 2x 86 ft.    [place]: challenging for patient to perform requiring max cueing for sequencing and demonstration 8x length of // bars    TherEx: cues for body mechanics and sequencing Nustep Lvl 4 RPM> 60 for cardiovascular challenge, seat position 12, 4 minutes for cardiovascular challenge. 6" step toe taps 12x each LE, no UE support RTB paloff press 10x each side  10x STS with weighted ball press (2000 gr); no LOB      Pt educated throughout session about proper posture and technique with  exercises. Improved exercise technique, movement at target joints, use of target muscles after min to mod verbal, visual, tactile cues     Patient is challenged with twisting as well as unstable surfaces at this time. Patient is very motivated throughout physical therapy session. Ambulation continues to be an area of focus as he frequently sways from left to right requiring PT assistance to remain upright when not utilizing SPC. Patient will benefit from skilled physical therapy to improve balance, gait mechanics, and overall mobility for decreased fall risk and improved quality of  life                 PT Education - 08/05/20 0803     Education Details exercise technique, body mechanics    Person(s) Educated Patient    Methods Explanation;Tactile cues;Demonstration;Verbal cues    Comprehension Verbalized understanding;Returned demonstration;Verbal cues required;Tactile cues required              PT Short Term Goals - 07/31/20 0831       PT SHORT TERM GOAL #1   Title Patient will be independent in home exercise program to improve strength/mobility for better functional independence with ADLs    Baseline 6/22: HEP given 7/27: HEP compliant    Time 4    Period Weeks    Status Achieved    Target Date 07/24/20               PT Long Term Goals - 07/31/20 3545       PT LONG TERM GOAL #1   Title Patient will increase FOTO score to equal to or greater than  62%   to demonstrate statistically significant improvement in mobility and quality of life.    Baseline 6/22: 50% 7/27: 57%    Time 12    Period Weeks    Status Partially Met    Target Date 09/18/20      PT LONG TERM GOAL #2   Title Patient will increase Berg Balance score by > 6 points (42/56)  to demonstrate decreased fall risk during functional activities.    Baseline 6/22: 36/56 7/27: 45/56    Time 12    Period Weeks    Status Achieved      PT LONG TERM GOAL #3   Title Patient will increase six minute walk test distance to >1500 without LOB for progression to age norm community ambulator and improve gait ability    Baseline 6/22: 1100 ft with frequent LOB and use of SPC 7/27: 935 ft with SPC and multiple near LOB    Time 12    Period Weeks    Status On-going    Target Date 09/18/20      PT LONG TERM GOAL #4   Title Patient will increase dynamic gait index score to >19/24 as to demonstrate reduced fall risk and improved dynamic gait balance for better safety with community/home ambulation.    Baseline 6/22: 10/24 with cane 7/27: 13/24    Time 12    Period Weeks     Status Partially Met    Target Date 09/18/20                   Plan - 08/05/20 0936     Clinical Impression Statement Patient is challenged with twisting as well as unstable surfaces at this time. Patient is very motivated throughout physical therapy session. Ambulation continues to be an area of focus as he frequently sways from left to right requiring PT  assistance to remain upright when not utilizing SPC. Patient will benefit from skilled physical therapy to improve balance, gait mechanics, and overall mobility for decreased fall risk and improved quality of life    Personal Factors and Comorbidities Age;Comorbidity 3+;Social Background;Time since onset of injury/illness/exacerbation;Fitness;Transportation    Comorbidities blindness in R eye, type II diabetes, HTN, HLD, stage 3a CKD, CHB, chronic renal failure, b12 deficiency, macular degeneration, hypercholesterolemia, PCO, cardiac pacemaker, PVCs, neuropathy    Examination-Activity Limitations Bed Mobility;Bend;Carry;Dressing;Reach Overhead;Locomotion Level;Lift;Squat;Stairs;Stand;Toileting;Transfers    Examination-Participation Restrictions Cleaning;Community Activity;Church;Driving;Meal Prep;Laundry;Shop;Volunteer;Yard Work    [employer]chant [employer] officer Evolving/Moderate complexity    Rehab Potential Good    PT Frequency 2x / week    PT Duration 12 weeks    PT Treatment/Interventions ADLs/Self Care Home Management;Biofeedback;Canalith Repostioning;Cryotherapy;Moist Heat;Traction;Ultrasound;Functional mobility training;Stair training;Gait training;DME Instruction;Therapeutic activities;Therapeutic exercise;Balance training;Neuromuscular re-education;Cognitive remediation;Manual techniques;Orthotic Fit/Training;Patient/family education;Passive range of motion;Dry needling;Vestibular;Taping;Energy conservation;Visual/perceptual remediation/compensation    PT Next Visit Plan walk with walking sticks    PT Home Exercise Plan see  above    Consulted and Agree with Plan of Care Patient             Patient will benefit from skilled therapeutic intervention in order to improve the following deficits and impairments:  Abnormal gait, Decreased activity tolerance, Decreased balance, Decreased endurance, Decreased coordination, Decreased knowledge of use of DME, Decreased mobility, Difficulty walking, Decreased strength, Increased edema, Impaired perceived functional ability, Impaired vision/preception, Postural dysfunction, Improper body mechanics  Visit Diagnosis: Unsteadiness on feet  Other abnormalities of gait and mobility     Problem List There are no problems to display for this patient.   Janna Arch, PT, DPT  08/05/2020, 9:37 AM  [place] MAIN [facility] SERVICES [address] [place], [place], [address] Phone: [phone]   Fax:  [phone]  Name: Kaycee Mcgaugh MRN: [id number] Date of Birth: Feb 10, 1940

## 2020-08-06 ENCOUNTER — Other Ambulatory Visit: Payer: Self-pay

## 2020-08-06 ENCOUNTER — Ambulatory Visit: Payer: Medicare Other | Attending: Internal Medicine

## 2020-08-06 DIAGNOSIS — Z23 Encounter for immunization: Secondary | ICD-10-CM

## 2020-08-06 MED ORDER — PFIZER-BIONT COVID-19 VAC-TRIS 30 MCG/0.3ML IM SUSP
INTRAMUSCULAR | 0 refills | Status: DC
  Filled 2020-08-06: qty 0.3, 1d supply, fill #0

## 2020-08-06 NOTE — Progress Notes (Signed)
   Covid-19 Vaccination Clinic  Name:  Charles Townsend    MRN: [id number] DOB: 09/28/1940  08/06/2020  Mr. Charles Townsend was observed post Covid-19 immunization for 15 minutes without incident. He was provided with Vaccine Information Sheet and instruction to access the V-Safe system.   Mr. Charles Townsend was instructed to call 911 with any severe reactions post vaccine: Difficulty breathing  Swelling of face and throat  A fast heartbeat  A bad rash all over body  Dizziness and weakness   Immunizations Administered     Name Date Dose VIS Date Route   PFIZER Comrnaty(Gray TOP) Covid-19 Vaccine 08/06/2020  2:08 PM 0.3 mL 12/14/2019 Intramuscular   Manufacturer: [place]   Lot: [id number]   Manuel Garcia: [place], PharmD, MBA Clinical Acute Care Pharmacist

## 2020-08-07 ENCOUNTER — Ambulatory Visit: Payer: Medicare Other

## 2020-08-07 ENCOUNTER — Other Ambulatory Visit: Payer: Self-pay

## 2020-08-07 DIAGNOSIS — R2689 Other abnormalities of gait and mobility: Secondary | ICD-10-CM

## 2020-08-07 DIAGNOSIS — R2681 Unsteadiness on feet: Secondary | ICD-10-CM | POA: Diagnosis not present

## 2020-08-07 NOTE — Therapy (Signed)
[place] MAIN [facility] SERVICES [address] [place], [place], [address] Phone: [phone]   Fax:  [phone]  Physical Therapy Treatment  Patient Details  Name: Charles Townsend MRN: [id number] Date of Birth: 28-Apr-1940 Referring Provider (PT): Wonda Cerise   Encounter Date: 08/07/2020   PT End of Session - 08/07/20 0755     Visit Number 12    Number of Visits 24    Date for PT Re-Evaluation 09/18/20    Authorization Type 2/10 PN 07/31/20    PT Start Time 0759    PT Stop Time 0844    PT Time Calculation (min) 45 min    Equipment Utilized During Treatment Gait belt    Activity Tolerance Patient tolerated treatment well    Behavior During Therapy [facility] for tasks assessed/performed             History reviewed. No pertinent past medical history.  History reviewed. No pertinent surgical history.  There were no vitals filed for this visit.   Subjective Assessment - 08/07/20 0801     Subjective Patient reports no soreness after last session. Has been compliant with HEP, reports having a peaceful day yesterday. Got his second booster yesterday.    Pertinent History Patient is a pleasant 80 year old who presents for balance and walking. Reports it has began about a year ago and has gotten progressively worse.  When he tries to walk he falls .Patient uses a cane mostly, stumbles frequently and falls 3x/month, normally forward.  PMH includes blindness in R eye, type II diabetes, HTN, HLD, stage 3a CKD, CHB, chronic renal failure, b12 deficiency, macular degeneration, hypercholesterolemia, PCO, cardiac pacemaker, PVCs, neuropathy. CT was clear with no evidence of acute intracranial abnormalities.    Limitations Lifting;Standing;Walking;House hold activities    How long can you sit comfortably? n/a    How long can you stand comfortably? 30 mins    How long can you walk comfortably? as he walks he starts to drift    Patient Stated Goals increase  his walking, decrease falls    Currently in Pain? No/denies                  Neuro Re-ed: close CGA with cues for safety awareness and body mechanics Step over orange hurdle ; 10x each LE with SUE support, second set of 10x each LE with flat hand on bar support; increased challenge with frequent posterior LOB with LLE movement  Airex pad hedgehog taps with SUE support 8x each LE for three hedgehogs  Airex pad: PVC pipe: chest press 10x, straight arm raise 10x, twist 10x each direction   ambulate in hallway: cue for sequencing and keeping feet apart, frequent LOB requiring PT assistance to remain upright 4x 86 ft.    [place]: challenging for patient to perform requiring max cueing for sequencing and demonstration 10x length of // bars    TherEx: cues for body mechanics and sequencing Nustep Lvl 4 RPM> 60 for cardiovascular challenge, seat position 11, 4 minutes for cardiovascular challenge. Lateral squat walk 4x length of // bars 10x STS with weighted ball press (2000 gr); no LOB      Pt educated throughout session about proper posture and technique with exercises. Improved exercise technique, movement at target joints, use of target muscles after min to mod verbal, visual, tactile cues    Patient is highly motivated throughout physical therapy session. Increased duration of ambulation without AD tolerated with patient requiring frequent  cues for width of BOS to increase steadiness as he frequently has posterior LOB. Single limb stabilization interventions are improving with decreased LOB. Patient will benefit from skilled physical therapy to improve balance, gait mechanics, and overall mobility for decreased fall risk and improved quality of life                 PT Education - 08/07/20 0754     Education Details exercise technique, body mechanics    Person(s) Educated Patient    Methods Explanation;Demonstration;Tactile cues;Verbal cues    Comprehension  Verbalized understanding;Returned demonstration;Verbal cues required;Tactile cues required              PT Short Term Goals - 07/31/20 0831       PT SHORT TERM GOAL #1   Title Patient will be independent in home exercise program to improve strength/mobility for better functional independence with ADLs    Baseline 6/22: HEP given 7/27: HEP compliant    Time 4    Period Weeks    Status Achieved    Target Date 07/24/20               PT Long Term Goals - 07/31/20 4782       PT LONG TERM GOAL #1   Title Patient will increase FOTO score to equal to or greater than  62%   to demonstrate statistically significant improvement in mobility and quality of life.    Baseline 6/22: 50% 7/27: 57%    Time 12    Period Weeks    Status Partially Met    Target Date 09/18/20      PT LONG TERM GOAL #2   Title Patient will increase Berg Balance score by > 6 points (42/56)  to demonstrate decreased fall risk during functional activities.    Baseline 6/22: 36/56 7/27: 45/56    Time 12    Period Weeks    Status Achieved      PT LONG TERM GOAL #3   Title Patient will increase six minute walk test distance to >1500 without LOB for progression to age norm community ambulator and improve gait ability    Baseline 6/22: 1100 ft with frequent LOB and use of SPC 7/27: 935 ft with SPC and multiple near LOB    Time 12    Period Weeks    Status On-going    Target Date 09/18/20      PT LONG TERM GOAL #4   Title Patient will increase dynamic gait index score to >19/24 as to demonstrate reduced fall risk and improved dynamic gait balance for better safety with community/home ambulation.    Baseline 6/22: 10/24 with cane 7/27: 13/24    Time 12    Period Weeks    Status Partially Met    Target Date 09/18/20                   Plan - 08/07/20 0830     Clinical Impression Statement Patient is highly motivated throughout physical therapy session. Increased duration of ambulation without AD  tolerated with patient requiring frequent cues for width of BOS to increase steadiness as he frequently has posterior LOB. Single limb stabilization interventions are improving with decreased LOB. Patient will benefit from skilled physical therapy to improve balance, gait mechanics, and overall mobility for decreased fall risk and improved quality of life    Personal Factors and Comorbidities Age;Comorbidity 3+;Social Background;Time since onset of injury/illness/exacerbation;Fitness;Transportation    Comorbidities blindness in R  eye, type II diabetes, HTN, HLD, stage 3a CKD, CHB, chronic renal failure, b12 deficiency, macular degeneration, hypercholesterolemia, PCO, cardiac pacemaker, PVCs, neuropathy    Examination-Activity Limitations Bed Mobility;Bend;Carry;Dressing;Reach Overhead;Locomotion Level;Lift;Squat;Stairs;Stand;Toileting;Transfers    Examination-Participation Restrictions Cleaning;Community Activity;Church;Driving;Meal Prep;Laundry;Shop;Volunteer;Yard Work    [employer] officer Evolving/Moderate complexity    Rehab Potential Good    PT Frequency 2x / week    PT Duration 12 weeks    PT Treatment/Interventions ADLs/Self Care Home Management;Biofeedback;Canalith Repostioning;Cryotherapy;Moist Heat;Traction;Ultrasound;Functional mobility training;Stair training;Gait training;DME Instruction;Therapeutic activities;Therapeutic exercise;Balance training;Neuromuscular re-education;Cognitive remediation;Manual techniques;Orthotic Fit/Training;Patient/family education;Passive range of motion;Dry needling;Vestibular;Taping;Energy conservation;Visual/perceptual remediation/compensation    PT Next Visit Plan walk with walking sticks    PT Home Exercise Plan see above    Consulted and Agree with Plan of Care Patient             Patient will benefit from skilled therapeutic intervention in order to improve the following deficits and impairments:  Abnormal gait, Decreased activity  tolerance, Decreased balance, Decreased endurance, Decreased coordination, Decreased knowledge of use of DME, Decreased mobility, Difficulty walking, Decreased strength, Increased edema, Impaired perceived functional ability, Impaired vision/preception, Postural dysfunction, Improper body mechanics  Visit Diagnosis: Unsteadiness on feet  Other abnormalities of gait and mobility     Problem List There are no problems to display for this patient.   Janna Arch, PT, DPT  08/07/2020, 8:45 AM  [place] MAIN [facility] SERVICES [address] [place], [place], [address] Phone: [phone]   Fax:  [phone]  Name: Charles Townsend MRN: [id number] Date of Birth: 1940-01-19

## 2020-08-14 ENCOUNTER — Ambulatory Visit: Payer: Medicare Other

## 2020-08-14 ENCOUNTER — Other Ambulatory Visit: Payer: Self-pay

## 2020-08-14 DIAGNOSIS — R2681 Unsteadiness on feet: Secondary | ICD-10-CM

## 2020-08-14 DIAGNOSIS — R2689 Other abnormalities of gait and mobility: Secondary | ICD-10-CM

## 2020-08-14 NOTE — Therapy (Signed)
[place] MAIN [facility] SERVICES [address] [place], [place], [address] Phone: [phone]   Fax:  [phone]  Physical Therapy Treatment  Patient Details  Name: Charles Townsend MRN: [id number] Date of Birth: [birth year] Referring Provider (PT): Wonda Cerise   Encounter Date: 08/14/2020   PT End of Session - 08/14/20 1020     Visit Number 13    Number of Visits 24    Date for PT Re-Evaluation 09/18/20    Authorization Type 3/10 PN 07/31/20    PT Start Time 1015    PT Stop Time 1059    PT Time Calculation (min) 44 min    Equipment Utilized During Treatment Gait belt    Activity Tolerance Patient tolerated treatment well    Behavior During Therapy [facility] for tasks assessed/performed             History reviewed. No pertinent past medical history.  History reviewed. No pertinent surgical history.  There were no vitals filed for this visit.   Subjective Assessment - 08/14/20 1019     Subjective Patient reports no falls or LOB since last session. Has been practicing walking on 2x4 at home.    Pertinent History Patient is a pleasant 80 year old who presents for balance and walking. Reports it has began about a year ago and has gotten progressively worse.  When he tries to walk he falls .Patient uses a cane mostly, stumbles frequently and falls 3x/month, normally forward.  PMH includes blindness in R eye, type II diabetes, HTN, HLD, stage 3a CKD, CHB, chronic renal failure, b12 deficiency, macular degeneration, hypercholesterolemia, PCO, cardiac pacemaker, PVCs, neuropathy. CT was clear with no evidence of acute intracranial abnormalities.    Limitations Lifting;Standing;Walking;House hold activities    How long can you sit comfortably? n/a    How long can you stand comfortably? 30 mins    How long can you walk comfortably? as he walks he starts to drift    Patient Stated Goals increase his walking, decrease falls    Currently in Pain?  No/denies                 Neuro Re-ed: close CGA with cues for safety awareness and body mechanics Step over orange hurdle ; 10x each LE with SUE support,  increased challenge with frequent posterior LOB with LLE movement   Airex pad cone taps with SUE support 8x each LE for three cones Airex pad: PVC pipe: chest press 10x, straight arm raise 10x, twist 10x each direction Airex pad: rainbow ball throw x 15 for pertubation inside/outside BOS  Airex balance beam: lateral stepping with finger tip support 8x length of // bars, frequent posterior LOB  Airex balance beam: tandem walking finger tip support 6x length of // bars.    ambulate in hallway: cue for sequencing and keeping feet apart, frequent LOB requiring PT assistance to remain upright 4x 86 ft.    TherEx: cues for body mechanics and sequencing Nustep Lvl 4 -5 RPM> 60 for cardiovascular challenge, seat position 11, 4 minutes for cardiovascular challenge. Lateral squat walk 4x length of // bars weighted ball overhead march (2000 gr) 12x each LE ; very challenging for patient  4lb ankle weights: -lateral stepping 6x length of // bars cues for foot clearance.  -high knee marches forward, backwards stepping back 6x length of // bars with BUE support -heel raise 20x with BUE support      Pt educated throughout session about  proper posture and technique with exercises. Improved exercise technique, movement at target joints, use of target muscles after min to mod verbal, visual, tactile cues    Patient tolerated progressive strengthening and stabilization interventions well. He is challenged with dual task and ambulation without AD with frequent return to narrow BOS and trunk sway. Posterior LOB is decreased in frequency but still present on unstable surfaces. Patient will benefit from skilled physical therapy to improve balance, gait mechanics, and overall mobility for decreased fall risk and improved quality of  life                    PT Education - 08/14/20 1019     Education Details exercise technique, body mechanics    Person(s) Educated Patient    Methods Explanation;Demonstration;Tactile cues;Verbal cues    Comprehension Returned demonstration;Verbal cues required;Tactile cues required;Verbalized understanding              PT Short Term Goals - 07/31/20 0831       PT SHORT TERM GOAL #1   Title Patient will be independent in home exercise program to improve strength/mobility for better functional independence with ADLs    Baseline 6/22: HEP given 7/27: HEP compliant    Time 4    Period Weeks    Status Achieved    Target Date 07/24/20               PT Long Term Goals - 07/31/20 1191       PT LONG TERM GOAL #1   Title Patient will increase FOTO score to equal to or greater than  62%   to demonstrate statistically significant improvement in mobility and quality of life.    Baseline 6/22: 50% 7/27: 57%    Time 12    Period Weeks    Status Partially Met    Target Date 09/18/20      PT LONG TERM GOAL #2   Title Patient will increase Berg Balance score by > 6 points (42/56)  to demonstrate decreased fall risk during functional activities.    Baseline 6/22: 36/56 7/27: 45/56    Time 12    Period Weeks    Status Achieved      PT LONG TERM GOAL #3   Title Patient will increase six minute walk test distance to >1500 without LOB for progression to age norm community ambulator and improve gait ability    Baseline 6/22: 1100 ft with frequent LOB and use of SPC 7/27: 935 ft with SPC and multiple near LOB    Time 12    Period Weeks    Status On-going    Target Date 09/18/20      PT LONG TERM GOAL #4   Title Patient will increase dynamic gait index score to >19/24 as to demonstrate reduced fall risk and improved dynamic gait balance for better safety with community/home ambulation.    Baseline 6/22: 10/24 with cane 7/27: 13/24    Time 12    Period Weeks     Status Partially Met    Target Date 09/18/20                   Plan - 08/14/20 1102     Clinical Impression Statement Patient tolerated progressive strengthening and stabilization interventions well. He is challenged with dual task and ambulation without AD with frequent return to narrow BOS and trunk sway. Posterior LOB is decreased in frequency but still present on unstable surfaces. Patient  will benefit from skilled physical therapy to improve balance, gait mechanics, and overall mobility for decreased fall risk and improved quality of life    Personal Factors and Comorbidities Age;Comorbidity 3+;Social Background;Time since onset of injury/illness/exacerbation;Fitness;Transportation    Comorbidities blindness in R eye, type II diabetes, HTN, HLD, stage 3a CKD, CHB, chronic renal failure, b12 deficiency, macular degeneration, hypercholesterolemia, PCO, cardiac pacemaker, PVCs, neuropathy    Examination-Activity Limitations Bed Mobility;Bend;Carry;Dressing;Reach Overhead;Locomotion Level;Lift;Squat;Stairs;Stand;Toileting;Transfers    Examination-Participation Restrictions Cleaning;Community Activity;Church;Driving;Meal Prep;Laundry;Shop;Volunteer;Yard Work    [employer] officer Evolving/Moderate complexity    Rehab Potential Good    PT Frequency 2x / week    PT Duration 12 weeks    PT Treatment/Interventions ADLs/Self Care Home Management;Biofeedback;Canalith Repostioning;Cryotherapy;Moist Heat;Traction;Ultrasound;Functional mobility training;Stair training;Gait training;DME Instruction;Therapeutic activities;Therapeutic exercise;Balance training;Neuromuscular re-education;Cognitive remediation;Manual techniques;Orthotic Fit/Training;Patient/family education;Passive range of motion;Dry needling;Vestibular;Taping;Energy conservation;Visual/perceptual remediation/compensation    PT Next Visit Plan walk with walking sticks    PT Home Exercise Plan see above    Consulted and  Agree with Plan of Care Patient             Patient will benefit from skilled therapeutic intervention in order to improve the following deficits and impairments:  Abnormal gait, Decreased activity tolerance, Decreased balance, Decreased endurance, Decreased coordination, Decreased knowledge of use of DME, Decreased mobility, Difficulty walking, Decreased strength, Increased edema, Impaired perceived functional ability, Impaired vision/preception, Postural dysfunction, Improper body mechanics  Visit Diagnosis: Unsteadiness on feet  Other abnormalities of gait and mobility     Problem List There are no problems to display for this patient.  Janna Arch, PT, DPT  08/14/2020, 11:04 AM  [place] MAIN [facility] SERVICES [address] [place], [place], [address] Phone: [phone]   Fax:  [phone]  Name: Charles Townsend MRN: [id number] Date of Birth: 01/17/1940

## 2020-08-19 ENCOUNTER — Other Ambulatory Visit: Payer: Self-pay

## 2020-08-19 ENCOUNTER — Encounter: Payer: Self-pay | Admitting: Physical Therapy

## 2020-08-19 ENCOUNTER — Ambulatory Visit: Payer: Medicare Other | Admitting: Physical Therapy

## 2020-08-19 DIAGNOSIS — R2681 Unsteadiness on feet: Secondary | ICD-10-CM | POA: Diagnosis not present

## 2020-08-19 DIAGNOSIS — R2689 Other abnormalities of gait and mobility: Secondary | ICD-10-CM

## 2020-08-19 NOTE — Therapy (Signed)
[place] MAIN [facility] SERVICES [address] [place], [place], [address] Phone: [phone]   Fax:  [phone]  Physical Therapy Treatment  Patient Details  Name: Charles Townsend MRN: [id number] Date of Birth: 07/07/1940 Referring Provider (PT): Wonda Cerise   Encounter Date: 08/19/2020   PT End of Session - 08/19/20 0808     Visit Number 14    Number of Visits 24    Date for PT Re-Evaluation 09/18/20    Authorization Type 4/10 PN 07/31/20    PT Start Time 0804    PT Stop Time 0845    PT Time Calculation (min) 41 min    Equipment Utilized During Treatment Gait belt    Activity Tolerance Patient tolerated treatment well    Behavior During Therapy [facility] for tasks assessed/performed             History reviewed. No pertinent past medical history.  History reviewed. No pertinent surgical history.  There were no vitals filed for this visit.   Subjective Assessment - 08/19/20 0808     Subjective Patient reports no falls or LOB since last session. Reports adherence with HEP; No new complaints.    Pertinent History Patient is a pleasant 80 year old who presents for balance and walking. Reports it has began about a year ago and has gotten progressively worse.  When he tries to walk he falls .Patient uses a cane mostly, stumbles frequently and falls 3x/month, normally forward.  PMH includes blindness in R eye, type II diabetes, HTN, HLD, stage 3a CKD, CHB, chronic renal failure, b12 deficiency, macular degeneration, hypercholesterolemia, PCO, cardiac pacemaker, PVCs, neuropathy. CT was clear with no evidence of acute intracranial abnormalities.    Limitations Lifting;Standing;Walking;House hold activities    How long can you sit comfortably? n/a    How long can you stand comfortably? 30 mins    How long can you walk comfortably? as he walks he starts to drift    Patient Stated Goals increase his walking, decrease falls    Currently in Pain?  No/denies    Multiple Pain Sites No                  Neuro Re-ed: close CGA with cues for safety awareness and body mechanics Standing on airex pad: -eyes open 30 sec hold, eyes closed 15 sec hold with heavy posterior lean and decreased reaction strategies x3 reps with mod VCs for erect posture and weight shift for netural stance;  Standing on 1/2 bolster: -heel/toe raises x15 reps with BUE rail assist -standing in neutral unsupported working on balancing with neutral stance 15-30 sec x5-6 attempts with increased posterior lean; required mod-max verbal cues working on ankle and hip strategies to improve neutral position;    Standing one foot on airex, one foot on 8 inch step, unsupported 15 sec hold x1 rep each with increased left lateral loss of balance especially with LLE on airex and right foot on step;    TherEx: cues for body mechanics and sequencing Nustep Lvl 2-3 RPM> 60 for cardiovascular challenge, seat position 11, 4 minutes for cardiovascular challenge.  Lateral squat walk 3x length of // bars Forward lunge unsupported x10 reps with min A for balance and cues for lateral weight shift for neutral balance. Patient often falls to left side or posteriorly. Had a harder time with right foot ahead of left foot;    4lb ankle weights: -hip extension with BUE rail assist x15 reps each  LE with cues to avoid forward lean to isolate posterior hip strengthening;  -high knee marches unsupported x15 reps each LE with cues for core stabilization and to improve neutral stance for better balance control; -heel raise 3 sec hold x15 without UE support, min A for safety;     Pt educated throughout session about proper posture and technique with exercises. Improved exercise technique, movement at target joints, use of target muscles after min to mod verbal, visual, tactile cues     Patient tolerated progressive strengthening and stabilization interventions well. He does exhibit increased  left lateral loss of balance and posterior loss of balance. Patient did have increased difficulty standing on LLE with increased loss of balance compared to standing on RLE. He does exhibit decreased reaction strategies requiring min-mod A for balance recovery.  He does report increased fatigue at end of session but denies any pain;                      PT Education - 08/19/20 0808     Education Details exercise technique/body mechanics;    Person(s) Educated Patient    Methods Explanation;Verbal cues    Comprehension Verbalized understanding;Returned demonstration;Verbal cues required;Need further instruction              PT Short Term Goals - 07/31/20 0831       PT SHORT TERM GOAL #1   Title Patient will be independent in home exercise program to improve strength/mobility for better functional independence with ADLs    Baseline 6/22: HEP given 7/27: HEP compliant    Time 4    Period Weeks    Status Achieved    Target Date 07/24/20               PT Long Term Goals - 07/31/20 4268       PT LONG TERM GOAL #1   Title Patient will increase FOTO score to equal to or greater than  62%   to demonstrate statistically significant improvement in mobility and quality of life.    Baseline 6/22: 50% 7/27: 57%    Time 12    Period Weeks    Status Partially Met    Target Date 09/18/20      PT LONG TERM GOAL #2   Title Patient will increase Berg Balance score by > 6 points (42/56)  to demonstrate decreased fall risk during functional activities.    Baseline 6/22: 36/56 7/27: 45/56    Time 12    Period Weeks    Status Achieved      PT LONG TERM GOAL #3   Title Patient will increase six minute walk test distance to >1500 without LOB for progression to age norm community ambulator and improve gait ability    Baseline 6/22: 1100 ft with frequent LOB and use of SPC 7/27: 935 ft with SPC and multiple near LOB    Time 12    Period Weeks    Status On-going     Target Date 09/18/20      PT LONG TERM GOAL #4   Title Patient will increase dynamic gait index score to >19/24 as to demonstrate reduced fall risk and improved dynamic gait balance for better safety with community/home ambulation.    Baseline 6/22: 10/24 with cane 7/27: 13/24    Time 12    Period Weeks    Status Partially Met    Target Date 09/18/20  Plan - 08/19/20 0927     Clinical Impression Statement Patient motivated and participated well within session. He was instructed in advanced LE strengthening exercise, working on reducing rail assist to challenge balance. Patient does require min VCs for proper positioning and exercise technique including to increase core stabilization for better stance control. He does exhibit decreased reaction strategies and decreased hip/ankle strategies leading to increased posterior loss of balance. He also exhibits increased left lateral loss of balance. Required min-mod A for balance recovery with reduced rail assist. Patient would benefit from additional skilled PT intervention to improve strength and balance control.    Personal Factors and Comorbidities Age;Comorbidity 3+;Social Background;Time since onset of injury/illness/exacerbation;Fitness;Transportation    Comorbidities blindness in R eye, type II diabetes, HTN, HLD, stage 3a CKD, CHB, chronic renal failure, b12 deficiency, macular degeneration, hypercholesterolemia, PCO, cardiac pacemaker, PVCs, neuropathy    Examination-Activity Limitations Bed Mobility;Bend;Carry;Dressing;Reach Overhead;Locomotion Level;Lift;Squat;Stairs;Stand;Toileting;Transfers    Examination-Participation Restrictions Cleaning;Community Activity;Church;Driving;Meal Prep;Laundry;Shop;Volunteer;Yard Work    [employer] officer Evolving/Moderate complexity    Rehab Potential Good    PT Frequency 2x / week    PT Duration 12 weeks    PT Treatment/Interventions ADLs/Self Care Home  Management;Biofeedback;Canalith Repostioning;Cryotherapy;Moist Heat;Traction;Ultrasound;Functional mobility training;Stair training;Gait training;DME Instruction;Therapeutic activities;Therapeutic exercise;Balance training;Neuromuscular re-education;Cognitive remediation;Manual techniques;Orthotic Fit/Training;Patient/family education;Passive range of motion;Dry needling;Vestibular;Taping;Energy conservation;Visual/perceptual remediation/compensation    PT Next Visit Plan walk with walking sticks    PT Home Exercise Plan see above    Consulted and Agree with Plan of Care Patient             Patient will benefit from skilled therapeutic intervention in order to improve the following deficits and impairments:  Abnormal gait, Decreased activity tolerance, Decreased balance, Decreased endurance, Decreased coordination, Decreased knowledge of use of DME, Decreased mobility, Difficulty walking, Decreased strength, Increased edema, Impaired perceived functional ability, Impaired vision/preception, Postural dysfunction, Improper body mechanics  Visit Diagnosis: Unsteadiness on feet  Other abnormalities of gait and mobility     Problem List There are no problems to display for this patient.   Trotter,Margaret PT, DPT 08/19/2020, 9:30 AM  [place] MAIN [facility] SERVICES [address] [place], [place], [address] Phone: [phone]   Fax:  [phone]  Name: Charles Townsend MRN: [id number] Date of Birth: [birth year]

## 2020-08-20 ENCOUNTER — Ambulatory Visit: Payer: Medicare Other | Admitting: Physical Therapy

## 2020-08-20 ENCOUNTER — Ambulatory Visit: Payer: Medicare Other

## 2020-08-21 ENCOUNTER — Other Ambulatory Visit: Payer: Self-pay

## 2020-08-21 ENCOUNTER — Ambulatory Visit: Payer: Medicare Other | Admitting: Physical Therapy

## 2020-08-21 DIAGNOSIS — R2681 Unsteadiness on feet: Secondary | ICD-10-CM | POA: Diagnosis not present

## 2020-08-21 DIAGNOSIS — R2689 Other abnormalities of gait and mobility: Secondary | ICD-10-CM

## 2020-08-21 NOTE — Therapy (Signed)
[place] [facility] SERVICES [address] [place], [place], [address] Phone: [phone]   Fax:  [phone]  Physical Therapy Treatment  Patient Details  Name: Charles Townsend MRN: [id number] Date of Birth: 12/06/1940 Referring Provider (PT): Mason, Sarah E   Encounter Date: 08/21/2020   PT End of Session - 08/21/20 0905     Visit Number 15    Number of Visits 24    Date for PT Re-Evaluation 09/18/20    Authorization Type 4/10 PN 07/31/20    PT Start Time 0801    PT Stop Time 0847    PT Time Calculation (min) 46 min    Equipment Utilized During Treatment Gait belt    Activity Tolerance Patient tolerated treatment well    Behavior During Therapy WFL for tasks assessed/performed             No past medical history on file.  No past surgical history on file.  There were no vitals filed for this visit.   Subjective Assessment - 08/21/20 0803     Subjective Pt reports feeling less steady on his feet this morning compared to typical. Reports compliance with HEP. No questions or concerns.    Pertinent History Patient is a pleasant 79 year old who presents for balance and walking. Reports it has began about a year ago and has gotten progressively worse.  When he tries to walk he falls .Patient uses a cane mostly, stumbles frequently and falls 3x/month, normally forward.  PMH includes blindness in R eye, type II diabetes, HTN, HLD, stage 3a CKD, CHB, chronic renal failure, b12 deficiency, macular degeneration, hypercholesterolemia, PCO, cardiac pacemaker, PVCs, neuropathy. CT was clear with no evidence of acute intracranial abnormalities.    Limitations Lifting;Standing;Walking;House hold activities    How long can you sit comfortably? n/a    How long can you stand comfortably? 30 mins    How long can you walk comfortably? as he walks he starts to drift    Patient Stated Goals increase his walking, decrease falls    Currently in Pain?  No/denies    Multiple Pain Sites No               Neuro Re-ed: close CGA with cues for safety awareness and body mechanics Standing on airex pad: -eyes open 30 sec hold, eyes closed 15 sec hold with heavy posterior lean and decreased reaction strategies x3 reps with mod VCs for erect posture and weight shift for netural stance;   Standing on 1/2 bolster: -heel/toe raises x15 reps with SUE rail assist -standing in neutral unsupported working on balancing with neutral stance 15-30 sec x3 attempts with increased posterior lean; required mod-max verbal cues working on ankle and hip strategies and weight shift to improve neutral position;    Standing one foot on airex, one foot on 6 inch step, unsupported 45 sec hold eyes open and 15 sec eyes closed x1 rep each with increased left lateral loss of balance especially with LLE on airex and right foot on step;    TherEx: cues for body mechanics and sequencing Nustep LE only, Lvl 2-4 RPM> 70 for cardiovascular challenge, seat position 11, 5 minutes for cardiovascular challenge.   Lateral squat walk 4x length of // bars Forward lunge unsupported 2x10 reps with min A for balance and cues for lateral weight shift for neutral balance and to push powerfully back to standing. Patient often falls to left side or posteriorly;    4lb   ankle weights: -hip extension with BUE rail assist x15 reps each LE with cues to avoid forward lean to isolate posterior hip strengthening;  -high knee marches unsupported x15 reps each LE with cues for core stabilization and to improve neutral stance for better balance control; -heel raise 3 sec hold x15 without UE support, min A for safety;     Pt educated throughout session about proper posture and technique with exercises. Improved exercise technique, movement at target joints, use of target muscles after min to mod verbal, visual, tactile cues     Patient tolerated progressive strengthening and stabilization  interventions well. Pt performed higher level of resistance, increased RPM and time on NuStep demo improved endurance. Additional reps of 2 strengthening exercises were performed. He does exhibit increased left lateral loss of balance and posterior loss of balance. Patient did have increased difficulty and LOB standing on LLE compared to standing on RLE. He does exhibit impaired reaction strategies requiring min-mod A for balance recovery. He does report increased fatigue in BLE at end of session but denies pain. Pt will continue to benefit from skilled therapy to improve safety and stability in standing for improved QOL and decreased risk of falling.             PT Short Term Goals - 07/31/20 0831       PT SHORT TERM GOAL #1   Title Patient will be independent in home exercise program to improve strength/mobility for better functional independence with ADLs    Baseline 6/22: HEP given 7/27: HEP compliant    Time 4    Period Weeks    Status Achieved    Target Date 07/24/20               PT Long Term Goals - 07/31/20 0832       PT LONG TERM GOAL #1   Title Patient will increase FOTO score to equal to or greater than  62%   to demonstrate statistically significant improvement in mobility and quality of life.    Baseline 6/22: 50% 7/27: 57%    Time 12    Period Weeks    Status Partially Met    Target Date 09/18/20      PT LONG TERM GOAL #2   Title Patient will increase Berg Balance score by > 6 points (42/56)  to demonstrate decreased fall risk during functional activities.    Baseline 6/22: 36/56 7/27: 45/56    Time 12    Period Weeks    Status Achieved      PT LONG TERM GOAL #3   Title Patient will increase six minute walk test distance to >1500 without LOB for progression to age norm community ambulator and improve gait ability    Baseline 6/22: 1100 ft with frequent LOB and use of SPC 7/27: 935 ft with SPC and multiple near LOB    Time 12    Period Weeks    Status  On-going    Target Date 09/18/20      PT LONG TERM GOAL #4   Title Patient will increase dynamic gait index score to >19/24 as to demonstrate reduced fall risk and improved dynamic gait balance for better safety with community/home ambulation.    Baseline 6/22: 10/24 with cane 7/27: 13/24    Time 12    Period Weeks    Status Partially Met    Target Date 09/18/20                     Plan - 08/21/20 0905     Clinical Impression Statement Patient tolerated progressive strengthening and stabilization interventions well. Pt performed higher level of resistance, increased RPM and time on NuStep demo improved endurance. Additional reps of 2 strengthening exercises were performed. He does exhibit increased left lateral loss of balance and posterior loss of balance. Patient did have increased difficulty and LOB standing on LLE compared to standing on RLE. He does exhibit impaired reaction strategies requiring min-mod A for balance recovery. He does report increased fatigue in BLE at end of session but denies pain. Pt will continue to benefit from skilled therapy to improve safety and stability in standing for improved QOL and decreased risk of falling.    Personal Factors and Comorbidities Age;Comorbidity 3+;Social Background;Time since onset of injury/illness/exacerbation;Fitness;Transportation    Comorbidities blindness in R eye, type II diabetes, HTN, HLD, stage 3a CKD, CHB, chronic renal failure, b12 deficiency, macular degeneration, hypercholesterolemia, PCO, cardiac pacemaker, PVCs, neuropathy    Examination-Activity Limitations Bed Mobility;Bend;Carry;Dressing;Reach Overhead;Locomotion Level;Lift;Squat;Stairs;Stand;Toileting;Transfers    Examination-Participation Restrictions Cleaning;Community Activity;Church;Driving;Meal Prep;Laundry;Shop;Volunteer;Yard Work    [employer] officer Evolving/Moderate complexity    Rehab Potential Good    PT Frequency 2x / week    PT  Duration 12 weeks    PT Treatment/Interventions ADLs/Self Care Home Management;Biofeedback;Canalith Repostioning;Cryotherapy;Moist Heat;Traction;Ultrasound;Functional mobility training;Stair training;Gait training;DME Instruction;Therapeutic activities;Therapeutic exercise;Balance training;Neuromuscular re-education;Cognitive remediation;Manual techniques;Orthotic Fit/Training;Patient/family education;Passive range of motion;Dry needling;Vestibular;Taping;Energy conservation;Visual/perceptual remediation/compensation    PT Next Visit Plan walk with walking sticks    PT Home Exercise Plan see above    Consulted and Agree with Plan of Care Patient             Patient will benefit from skilled therapeutic intervention in order to improve the following deficits and impairments:  Abnormal gait, Decreased activity tolerance, Decreased balance, Decreased endurance, Decreased coordination, Decreased knowledge of use of DME, Decreased mobility, Difficulty walking, Decreased strength, Increased edema, Impaired perceived functional ability, Impaired vision/preception, Postural dysfunction, Improper body mechanics  Visit Diagnosis: Unsteadiness on feet  Other abnormalities of gait and mobility     Problem List There are no problems to display for this patient.   Patrina Levering PT, DPT  [facility] 08/21/2020, 9:06 AM  [place] MAIN [facility] SERVICES [address] [place], [place], [address] Phone: [phone]   Fax:  [phone]  Name: Charles Townsend MRN: [id number] Date of Birth: February 01, 1940

## 2020-08-22 ENCOUNTER — Ambulatory Visit: Payer: Medicare Other

## 2020-08-27 ENCOUNTER — Ambulatory Visit: Payer: Medicare Other

## 2020-08-29 ENCOUNTER — Ambulatory Visit: Payer: Medicare Other

## 2020-09-02 ENCOUNTER — Ambulatory Visit: Payer: Medicare Other

## 2020-09-03 ENCOUNTER — Ambulatory Visit: Payer: Medicare Other

## 2020-09-04 ENCOUNTER — Ambulatory Visit: Payer: Medicare Other

## 2020-09-04 ENCOUNTER — Other Ambulatory Visit: Payer: Self-pay

## 2020-09-04 DIAGNOSIS — R2689 Other abnormalities of gait and mobility: Secondary | ICD-10-CM

## 2020-09-04 DIAGNOSIS — M6281 Muscle weakness (generalized): Secondary | ICD-10-CM

## 2020-09-04 DIAGNOSIS — R2681 Unsteadiness on feet: Secondary | ICD-10-CM | POA: Diagnosis not present

## 2020-09-04 DIAGNOSIS — R278 Other lack of coordination: Secondary | ICD-10-CM

## 2020-09-04 DIAGNOSIS — R262 Difficulty in walking, not elsewhere classified: Secondary | ICD-10-CM

## 2020-09-04 DIAGNOSIS — R269 Unspecified abnormalities of gait and mobility: Secondary | ICD-10-CM

## 2020-09-04 NOTE — Therapy (Signed)
[place] MAIN [facility] SERVICES [address] [place], [place], [address] Phone: [phone]   Fax:  [phone]  Physical Therapy Treatment  Patient Details  Name: Charles Townsend MRN: [id number] Date of Birth: 02/27/1940 Referring Provider (PT): Wonda Cerise   Encounter Date: 09/04/2020   PT End of Session - 09/04/20 0811     Visit Number 16    Number of Visits 24    Date for PT Re-Evaluation 09/18/20    Authorization Type 4/10 PN 07/31/20    PT Start Time 0806    PT Stop Time 0848    PT Time Calculation (min) 42 min    Equipment Utilized During Treatment Gait belt    Activity Tolerance Patient tolerated treatment well    Behavior During Therapy [facility] for tasks assessed/performed             History reviewed. No pertinent past medical history.  History reviewed. No pertinent surgical history.  There were no vitals filed for this visit.   Subjective Assessment - 09/04/20 0810     Subjective Patient reports doing well this week so far. He denies any pain or falls.    Pertinent History Patient is a pleasant 80 year old who presents for balance and walking. Reports it has began about a year ago and has gotten progressively worse.  When he tries to walk he falls .Patient uses a cane mostly, stumbles frequently and falls 3x/month, normally forward.  PMH includes blindness in R eye, type II diabetes, HTN, HLD, stage 3a CKD, CHB, chronic renal failure, b12 deficiency, macular degeneration, hypercholesterolemia, PCO, cardiac pacemaker, PVCs, neuropathy. CT was clear with no evidence of acute intracranial abnormalities.    Limitations Lifting;Standing;Walking;House hold activities    How long can you sit comfortably? n/a    How long can you stand comfortably? 30 mins    How long can you walk comfortably? as he walks he starts to drift    Patient Stated Goals increase his walking, decrease falls              Interventions:       Neuro Re-ed: close CGA with cues for safety awareness and body mechanics Standing on airex pad: -eyes open 30 sec hold, eyes closed 15 sec hold with intermittent posterior lean exhibiting decreased reaction strategies x 2 sets with Min VCs and visual demo for erect posture and weight shift for netural stance; Dynamic A/P weight shifting- Focusing on moving hips and patient with some difficulty with weight shift posterior x 20 reps. Dynamic Lateral weight shifting- x 20 reps with intermittent difficulty - loss of balance 3-4 times requiring UE support.  Dynamic marching without UE support x 60 sec x 2 trials- difficulty requiring UE Support.    Standing on 1/2 bolster: -standing in neutral unsupported working on balancing with neutral stance 15-30 sec x3 attempts with increased posterior lean; required mod-max verbal cues working on ankle and hip strategies and weight shift to improve neutral position   Sustained Forward lunge unsupported  then performing head turns left to right - x 10 each side then back - switch leg and repeat. Patient presents with some difficulty maintaining balance requiring occasional UE support.     Forward backward stepping in // bars over pvc pipe x 2 and foam roll x 10 down and back - attempting without UE support- Increased difficulty walking backward- Requiring some UE support at times.    TherEx: cues for body mechanics and  sequencing Nustep LE only, Lvl 2-4 RPM> 70 for cardiovascular challenge, seat position 11, 5 minutes for cardiovascular challenge.  Standing on 1/2 bolster: -heel/toe raises x15 reps with BUE  assist  Education provided throughout session via VC/TC and demonstration to facilitate movement at target joints and correct muscle activation for all testing and exercises performed.   Clinical Impression: Patient continues to be challenged with all balance activities requiring intermittent UE Support to prevent LOB. He presents with good  motivation and receptive to all VC and visual demo. He continues to present most difficulty with posterior LOB on non-compliant surfaces. Patient will benefit from skilled physical therapy to improve balance, gait mechanics, and overall mobility for decreased fall risk and improved quality of life                        PT Education - 09/04/20 0811     Education Details Exercise technique    Person(s) Educated Patient    Methods Explanation;Demonstration;Tactile cues;Verbal cues    Comprehension Verbalized understanding;Returned demonstration;Tactile cues required;Verbal cues required;Need further instruction              PT Short Term Goals - 07/31/20 0831       PT SHORT TERM GOAL #1   Title Patient will be independent in home exercise program to improve strength/mobility for better functional independence with ADLs    Baseline 6/22: HEP given 7/27: HEP compliant    Time 4    Period Weeks    Status Achieved    Target Date 07/24/20               PT Long Term Goals - 07/31/20 7654       PT LONG TERM GOAL #1   Title Patient will increase FOTO score to equal to or greater than  62%   to demonstrate statistically significant improvement in mobility and quality of life.    Baseline 6/22: 50% 7/27: 57%    Time 12    Period Weeks    Status Partially Met    Target Date 09/18/20      PT LONG TERM GOAL #2   Title Patient will increase Berg Balance score by > 6 points (42/56)  to demonstrate decreased fall risk during functional activities.    Baseline 6/22: 36/56 7/27: 45/56    Time 12    Period Weeks    Status Achieved      PT LONG TERM GOAL #3   Title Patient will increase six minute walk test distance to >1500 without LOB for progression to age norm community ambulator and improve gait ability    Baseline 6/22: 1100 ft with frequent LOB and use of SPC 7/27: 935 ft with SPC and multiple near LOB    Time 12    Period Weeks    Status On-going     Target Date 09/18/20      PT LONG TERM GOAL #4   Title Patient will increase dynamic gait index score to >19/24 as to demonstrate reduced fall risk and improved dynamic gait balance for better safety with community/home ambulation.    Baseline 6/22: 10/24 with cane 7/27: 13/24    Time 12    Period Weeks    Status Partially Met    Target Date 09/18/20                   Plan - 09/04/20 6503     Clinical Impression Statement Patient  continues to be challenged with all balance activities requiring intermittent UE Support to prevent LOB. He presents with good motivation and receptive to all VC and visual demo. He continues to present most difficulty with posterior LOB on non-compliant surfaces. Patient will benefit from skilled physical therapy to improve balance, gait mechanics, and overall mobility for decreased fall risk and improved quality of life    Personal Factors and Comorbidities Age;Comorbidity 3+;Social Background;Time since onset of injury/illness/exacerbation;Fitness;Transportation    Comorbidities blindness in R eye, type II diabetes, HTN, HLD, stage 3a CKD, CHB, chronic renal failure, b12 deficiency, macular degeneration, hypercholesterolemia, PCO, cardiac pacemaker, PVCs, neuropathy    Examination-Activity Limitations Bed Mobility;Bend;Carry;Dressing;Reach Overhead;Locomotion Level;Lift;Squat;Stairs;Stand;Toileting;Transfers    Examination-Participation Restrictions Cleaning;Community Activity;Church;Driving;Meal Prep;Laundry;Shop;Volunteer;Yard Work    [employer] officer Evolving/Moderate complexity    Rehab Potential Good    PT Frequency 2x / week    PT Duration 12 weeks    PT Treatment/Interventions ADLs/Self Care Home Management;Biofeedback;Canalith Repostioning;Cryotherapy;Moist Heat;Traction;Ultrasound;Functional mobility training;Stair training;Gait training;DME Instruction;Therapeutic activities;Therapeutic exercise;Balance training;Neuromuscular  re-education;Cognitive remediation;Manual techniques;Orthotic Fit/Training;Patient/family education;Passive range of motion;Dry needling;Vestibular;Taping;Energy conservation;Visual/perceptual remediation/compensation    PT Next Visit Plan Continue with progressive Balance and LE Strengthening training next visit.    PT Home Exercise Plan --    Consulted and Agree with Plan of Care Patient             Patient will benefit from skilled therapeutic intervention in order to improve the following deficits and impairments:  Abnormal gait, Decreased activity tolerance, Decreased balance, Decreased endurance, Decreased coordination, Decreased knowledge of use of DME, Decreased mobility, Difficulty walking, Decreased strength, Increased edema, Impaired perceived functional ability, Impaired vision/preception, Postural dysfunction, Improper body mechanics  Visit Diagnosis: Abnormality of gait and mobility  Difficulty in walking, not elsewhere classified  Muscle weakness (generalized)  Other abnormalities of gait and mobility  Other lack of coordination     Problem List There are no problems to display for this patient.   Lewis Moccasin, PT 09/04/2020, 1:23 PM  [place] MAIN [facility] SERVICES [address] [place], [place], [address] Phone: [phone]   Fax:  [phone]  Name: Jaycee Pelzer MRN: [id number] Date of Birth: 21-Sep-1940

## 2020-09-11 ENCOUNTER — Ambulatory Visit: Payer: Medicare Other | Attending: Physician Assistant

## 2020-09-11 ENCOUNTER — Ambulatory Visit: Payer: Medicare Other

## 2020-09-11 ENCOUNTER — Other Ambulatory Visit: Payer: Self-pay

## 2020-09-11 DIAGNOSIS — R278 Other lack of coordination: Secondary | ICD-10-CM

## 2020-09-11 DIAGNOSIS — R269 Unspecified abnormalities of gait and mobility: Secondary | ICD-10-CM | POA: Diagnosis not present

## 2020-09-11 DIAGNOSIS — R2689 Other abnormalities of gait and mobility: Secondary | ICD-10-CM | POA: Insufficient documentation

## 2020-09-11 DIAGNOSIS — R262 Difficulty in walking, not elsewhere classified: Secondary | ICD-10-CM

## 2020-09-11 DIAGNOSIS — R2681 Unsteadiness on feet: Secondary | ICD-10-CM | POA: Insufficient documentation

## 2020-09-11 DIAGNOSIS — M6281 Muscle weakness (generalized): Secondary | ICD-10-CM | POA: Diagnosis present

## 2020-09-11 NOTE — Therapy (Signed)
[place] MAIN [facility] SERVICES [address] [place], [place], [address] Phone: [phone]   Fax:  [phone]  Physical Therapy Treatment  Patient Details  Name: Charles Townsend MRN: [id number] Date of Birth: 1940-01-26 Referring Provider (PT): Wonda Cerise   Encounter Date: 09/11/2020   PT End of Session - 09/11/20 0807     Visit Number 17    Number of Visits 24    Date for PT Re-Evaluation 09/18/20    Authorization Type 4/10 PN 07/31/20    PT Start Time 0803    PT Stop Time 0847    PT Time Calculation (min) 44 min    Equipment Utilized During Treatment Gait belt    Activity Tolerance Patient tolerated treatment well    Behavior During Therapy [facility] for tasks assessed/performed             History reviewed. No pertinent past medical history.  History reviewed. No pertinent surgical history.  There were no vitals filed for this visit.   Subjective Assessment - 09/11/20 0805     Subjective Patient reports he did have a fall over the weekend - was turning and just kept turning and fell. He denies any injury and no pain reported.    Pertinent History Patient is a pleasant 80 year old who presents for balance and walking. Reports it has began about a year ago and has gotten progressively worse.  When he tries to walk he falls .Patient uses a cane mostly, stumbles frequently and falls 3x/month, normally forward.  PMH includes blindness in R eye, type II diabetes, HTN, HLD, stage 3a CKD, CHB, chronic renal failure, b12 deficiency, macular degeneration, hypercholesterolemia, PCO, cardiac pacemaker, PVCs, neuropathy. CT was clear with no evidence of acute intracranial abnormalities.    Limitations Lifting;Standing;Walking;House hold activities    How long can you sit comfortably? n/a    How long can you stand comfortably? 30 mins    How long can you walk comfortably? as he walks he starts to drift    Patient Stated Goals increase his walking,  decrease falls    Currently in Pain? No/denies               INTERVENTIONS:    Neuro re-education: at support bar near steps (utilized bar, corner section, 1/2 foam roll and blue airex pad today)   Calf raises on 1/2 foam roll with BUE support x 12 reps  Toe raises on 1/2 foam roll with BUE support x 12 reps  Static stand on 1/2 foam roll- Instructed to hold without UE support. Patient experienced increased difficulty maintaining balance exhibiting increased Posterior lean multiple times and unable to self correct using ankle/hip strategies.   Static stand on blue airex pad- 20 sec hold without UE support - no difficulty- progressed to ant/post weight shift x 20 reps each. Patient required VC and visual demo for correct hip movement- Patient was able to return demo correctly yet difficulty with post weight shift- requiring close CGA and VC.  Tandem standing in corner - chair placed in front and CGA- Patient unable to hold position > 3 seconds either leg *patient reports he has been doing them wrong at home- and that he was holding onto his "Barbecue" using B UE support.  Added Near Tandem standing in corner with slightly wider base to achieve some success in holding the position and patient was successful. - able to hold 10-20 sec each leg x 3.   Side stepping  x length of support bar and back x 5 times with No UE. 1 episode of posterior lean requiring CGA.   Side stepping over 1/2 foam roll without UE support x 12 reps each leg- mild difficulty with coordinating yet did improve with reps.  Access Code: [email or web address] URL: [email or web address] Date: 09/11/2020 Prepared by: Sande Brothers  Exercises Tandem Stance in Corner - 1 x daily - 3 x weekly - 4 sets - 10-20 hold Side Step Over - 1 x daily - 3 x weekly - 4 sets - 10-20 hold Standing Anterior Posterior Weight Shift - 1 x daily - 3 x weekly - 4 sets - 10-20 hold    Education provided throughout session via  VC/TC and demonstration to facilitate movement at target joints and correct muscle activation for all testing and exercises performed.   Clinical Impression: Patient presents today with good motivation - asking appropriate questions regarding his progress and his need to continue. He was receptive to all instruction and utilized VC, TC and visual demo for skilled instruction in all balance activities. He continues to present with mostly posterior lean and losing balance. Today's treatment focusing on utilizing ankle and hip strategies to weight shift and react and patient did improve with practice. Issued more balance exercises to focus on to continue to assist him at home with balance. He verbalized good understanding and returned demo of all exercises well today. Patient will benefit from skilled physical therapy to improve balance, gait mechanics, and overall mobility for decreased fall risk and improved quality of life                       PT Education - 09/11/20 0807     Education Details exercise technique    Person(s) Educated Patient    Methods Explanation;Demonstration;Tactile cues;Verbal cues    Comprehension Verbalized understanding;Returned demonstration;Verbal cues required;Tactile cues required;Need further instruction              PT Short Term Goals - 07/31/20 0831       PT SHORT TERM GOAL #1   Title Patient will be independent in home exercise program to improve strength/mobility for better functional independence with ADLs    Baseline 6/22: HEP given 7/27: HEP compliant    Time 4    Period Weeks    Status Achieved    Target Date 07/24/20               PT Long Term Goals - 07/31/20 6203       PT LONG TERM GOAL #1   Title Patient will increase FOTO score to equal to or greater than  62%   to demonstrate statistically significant improvement in mobility and quality of life.    Baseline 6/22: 50% 7/27: 57%    Time 12    Period Weeks     Status Partially Met    Target Date 09/18/20      PT LONG TERM GOAL #2   Title Patient will increase Berg Balance score by > 6 points (42/56)  to demonstrate decreased fall risk during functional activities.    Baseline 6/22: 36/56 7/27: 45/56    Time 12    Period Weeks    Status Achieved      PT LONG TERM GOAL #3   Title Patient will increase six minute walk test distance to >1500 without LOB for progression to age norm community ambulator and improve gait ability    Baseline 6/22: 1100 ft  with frequent LOB and use of [facility] 7/27: 935 ft with SPC and multiple near LOB    Time 12    Period Weeks    Status On-going    Target Date 09/18/20      PT LONG TERM GOAL #4   Title Patient will increase dynamic gait index score to >19/24 as to demonstrate reduced fall risk and improved dynamic gait balance for better safety with community/home ambulation.    Baseline 6/22: 10/24 with cane 7/27: 13/24    Time 12    Period Weeks    Status Partially Met    Target Date 09/18/20                   Plan - 09/11/20 0807     Clinical Impression Statement Patient presents today with good motivation - asking appropriate questions regarding his progress and his need to continue. He was receptive to all instruction and utilized VC, TC and visual demo for skilled instruction in all balance activities. He continues to present with mostly posterior lean and losing balance. Today's treatment focusing on utilizing ankle and hip strategies to weight shift and react and patient did improve with practice. Issued more balance exercises to focus on to continue to assist him at home with balance. He verbalized good understanding and returned demo of all exercises well today. Patient will benefit from skilled physical therapy to improve balance, gait mechanics, and overall mobility for decreased fall risk and improved quality of life    Personal Factors and Comorbidities Age;Comorbidity 3+;Social Background;Time  since onset of injury/illness/exacerbation;Fitness;Transportation    Comorbidities blindness in R eye, type II diabetes, HTN, HLD, stage 3a CKD, CHB, chronic renal failure, b12 deficiency, macular degeneration, hypercholesterolemia, PCO, cardiac pacemaker, PVCs, neuropathy    Examination-Activity Limitations Bed Mobility;Bend;Carry;Dressing;Reach Overhead;Locomotion Level;Lift;Squat;Stairs;Stand;Toileting;Transfers    Examination-Participation Restrictions Cleaning;Community Activity;Church;Driving;Meal Prep;Laundry;Shop;Volunteer;Yard Work    [employer] officer Evolving/Moderate complexity    Rehab Potential Good    PT Frequency 2x / week    PT Duration 12 weeks    PT Treatment/Interventions ADLs/Self Care Home Management;Biofeedback;Canalith Repostioning;Cryotherapy;Moist Heat;Traction;Ultrasound;Functional mobility training;Stair training;Gait training;DME Instruction;Therapeutic activities;Therapeutic exercise;Balance training;Neuromuscular re-education;Cognitive remediation;Manual techniques;Orthotic Fit/Training;Patient/family education;Passive range of motion;Dry needling;Vestibular;Taping;Energy conservation;Visual/perceptual remediation/compensation    PT Next Visit Plan Continue with progressive Balance and LE Strengthening training next visit.    PT Home Exercise Plan Access Code: [email or web address]  URL: [email or web address]    Consulted and Agree with Plan of Care Patient             Patient will benefit from skilled therapeutic intervention in order to improve the following deficits and impairments:  Abnormal gait, Decreased activity tolerance, Decreased balance, Decreased endurance, Decreased coordination, Decreased knowledge of use of DME, Decreased mobility, Difficulty walking, Decreased strength, Increased edema, Impaired perceived functional ability, Impaired vision/preception, Postural dysfunction, Improper body mechanics  Visit Diagnosis: Abnormality of  gait and mobility  Difficulty in walking, not elsewhere classified  Muscle weakness (generalized)  Other lack of coordination     Problem List There are no problems to display for this patient.   Lewis Moccasin, PT 09/11/2020, 9:10 AM  [place] MAIN [facility] SERVICES [address] [place], [place], [address] Phone: [phone]   Fax:  [phone]  Name: Charles Townsend MRN: [id number] Date of Birth: Jun 22, 1940

## 2020-09-16 ENCOUNTER — Other Ambulatory Visit: Payer: Self-pay

## 2020-09-16 ENCOUNTER — Ambulatory Visit: Payer: Medicare Other

## 2020-09-16 DIAGNOSIS — R262 Difficulty in walking, not elsewhere classified: Secondary | ICD-10-CM

## 2020-09-16 DIAGNOSIS — M6281 Muscle weakness (generalized): Secondary | ICD-10-CM

## 2020-09-16 DIAGNOSIS — R269 Unspecified abnormalities of gait and mobility: Secondary | ICD-10-CM | POA: Diagnosis not present

## 2020-09-16 NOTE — Therapy (Signed)
[place] MAIN [facility] SERVICES [address] [place], [place], [address] Phone: [phone]   Fax:  [phone]  Physical Therapy Treatment  Patient Details  Name: Charles Townsend MRN: [id number] Date of Birth: 1940/12/23 Referring Provider (PT): Wonda Cerise   Encounter Date: 09/16/2020   PT End of Session - 09/16/20 0759     Visit Number 18    Number of Visits 24    Date for PT Re-Evaluation 09/18/20    Authorization Type 8/10 PN 07/31/20    PT Start Time 0759    PT Stop Time 0844    PT Time Calculation (min) 45 min    Equipment Utilized During Treatment Gait belt    Activity Tolerance Patient tolerated treatment well    Behavior During Therapy [facility] for tasks assessed/performed             History reviewed. No pertinent past medical history.  History reviewed. No pertinent surgical history.  There were no vitals filed for this visit.   Subjective Assessment - 09/16/20 0801     Subjective Patient reports no falls since last visit. Had a quiet weekend. Has been compliant with HEP. Patient presents without cane to session.    Pertinent History Patient is a pleasant 80 year old who presents for balance and walking. Reports it has began about a year ago and has gotten progressively worse.  When he tries to walk he falls .Patient uses a cane mostly, stumbles frequently and falls 3x/month, normally forward.  PMH includes blindness in R eye, type II diabetes, HTN, HLD, stage 3a CKD, CHB, chronic renal failure, b12 deficiency, macular degeneration, hypercholesterolemia, PCO, cardiac pacemaker, PVCs, neuropathy. CT was clear with no evidence of acute intracranial abnormalities.    Limitations Lifting;Standing;Walking;House hold activities    How long can you sit comfortably? n/a    How long can you stand comfortably? 30 mins    How long can you walk comfortably? as he walks he starts to drift    Patient Stated Goals increase his walking,  decrease falls    Currently in Pain? No/denies                 Neuro Re-ed: close CGA with cues for safety awareness and body mechanics Step over half foam roller 10x each LE with occasional SUE support,  increased challenge with frequent posterior LOB with LLE movement  Lateral step over half foam roller; 12x each side; very challenging for patient with frequent posterior LOB.    Airex pad cone taps with SUE support 8x each LE for three cones Airex pad: PVC pipe: chest press 10x, straight arm raise 10x, twist 10x each direction   Added Near Tandem standing in corner with slightly wider base to achieve some success in holding the position and patient was successful. - able to hold 10-20 sec each leg x 3.    ambulate in hallway: -horizontal head turns with cues for reading alphabet from cards 86 ft -horizontal head turns with cues for reading number and symbols from cards 86 ft      TherEx: cues for body mechanics and sequencing Nustep Lvl 4 -5 RPM> 60 for cardiovascular challenge, seat position 11, 4 minutes for cardiovascular challenge.  10xSTS with arms crossed.  Heel raise with UE support 20x  Modified forward/backwards lunge 12x each side; SUE support ; very challenging for patient.      Pt educated throughout session about proper posture and technique with exercises. Improved  exercise technique, movement at target joints, use of target muscles after min to mod verbal, visual, tactile cues  Vitals monitored to ensure therapeutic range.    Patient presents to physical therapy with excellent motivation throughout physical therapy session. He is receptive to cueing for correction and stabilization. Ankle righting reactions and balance strategies tolerated well throughout session. Modified tandem stance positioning is challenging for patient requiring additional education. Patient will benefit from skilled physical therapy to improve balance, gait mechanics, and overall  mobility for decreased fall risk and improved quality of life                        PT Education - 09/16/20 0801     Education Details exercise technique, body mechanics    Person(s) Educated Patient    Methods Explanation;Demonstration;Tactile cues;Verbal cues    Comprehension Verbalized understanding;Returned demonstration;Verbal cues required;Tactile cues required              PT Short Term Goals - 07/31/20 0831       PT SHORT TERM GOAL #1   Title Patient will be independent in home exercise program to improve strength/mobility for better functional independence with ADLs    Baseline 6/22: HEP given 7/27: HEP compliant    Time 4    Period Weeks    Status Achieved    Target Date 07/24/20               PT Long Term Goals - 07/31/20 4128       PT LONG TERM GOAL #1   Title Patient will increase FOTO score to equal to or greater than  62%   to demonstrate statistically significant improvement in mobility and quality of life.    Baseline 6/22: 50% 7/27: 57%    Time 12    Period Weeks    Status Partially Met    Target Date 09/18/20      PT LONG TERM GOAL #2   Title Patient will increase Berg Balance score by > 6 points (42/56)  to demonstrate decreased fall risk during functional activities.    Baseline 6/22: 36/56 7/27: 45/56    Time 12    Period Weeks    Status Achieved      PT LONG TERM GOAL #3   Title Patient will increase six minute walk test distance to >1500 without LOB for progression to age norm community ambulator and improve gait ability    Baseline 6/22: 1100 ft with frequent LOB and use of SPC 7/27: 935 ft with SPC and multiple near LOB    Time 12    Period Weeks    Status On-going    Target Date 09/18/20      PT LONG TERM GOAL #4   Title Patient will increase dynamic gait index score to >19/24 as to demonstrate reduced fall risk and improved dynamic gait balance for better safety with community/home ambulation.    Baseline  6/22: 10/24 with cane 7/27: 13/24    Time 12    Period Weeks    Status Partially Met    Target Date 09/18/20                   Plan - 09/16/20 0829     Clinical Impression Statement Patient presents to physical therapy with excellent motivation throughout physical therapy session. He is receptive to cueing for correction and stabilization. Ankle righting reactions and balance strategies tolerated well throughout session. Modified tandem  stance positioning is challenging for patient requiring additional education. Patient will benefit from skilled physical therapy to improve balance, gait mechanics, and overall mobility for decreased fall risk and improved quality of life    Personal Factors and Comorbidities Age;Comorbidity 3+;Social Background;Time since onset of injury/illness/exacerbation;Fitness;Transportation    Comorbidities blindness in R eye, type II diabetes, HTN, HLD, stage 3a CKD, CHB, chronic renal failure, b12 deficiency, macular degeneration, hypercholesterolemia, PCO, cardiac pacemaker, PVCs, neuropathy    Examination-Activity Limitations Bed Mobility;Bend;Carry;Dressing;Reach Overhead;Locomotion Level;Lift;Squat;Stairs;Stand;Toileting;Transfers    Examination-Participation Restrictions Cleaning;Community Activity;Church;Driving;Meal Prep;Laundry;Shop;Volunteer;Yard Work    [employer] officer Evolving/Moderate complexity    Rehab Potential Good    PT Frequency 2x / week    PT Duration 12 weeks    PT Treatment/Interventions ADLs/Self Care Home Management;Biofeedback;Canalith Repostioning;Cryotherapy;Moist Heat;Traction;Ultrasound;Functional mobility training;Stair training;Gait training;DME Instruction;Therapeutic activities;Therapeutic exercise;Balance training;Neuromuscular re-education;Cognitive remediation;Manual techniques;Orthotic Fit/Training;Patient/family education;Passive range of motion;Dry needling;Vestibular;Taping;Energy  conservation;Visual/perceptual remediation/compensation    PT Next Visit Plan Continue with progressive Balance and LE Strengthening training next visit.    PT Home Exercise Plan Access Code: [email or web address]  URL: [email or web address]    Consulted and Agree with Plan of Care Patient             Patient will benefit from skilled therapeutic intervention in order to improve the following deficits and impairments:  Abnormal gait, Decreased activity tolerance, Decreased balance, Decreased endurance, Decreased coordination, Decreased knowledge of use of DME, Decreased mobility, Difficulty walking, Decreased strength, Increased edema, Impaired perceived functional ability, Impaired vision/preception, Postural dysfunction, Improper body mechanics  Visit Diagnosis: Abnormality of gait and mobility  Difficulty in walking, not elsewhere classified  Muscle weakness (generalized)     Problem List There are no problems to display for this patient.   Janna Arch, PT, DPT  09/16/2020, 8:44 AM  [place] MAIN [facility] SERVICES [address] [place], [place], [address] Phone: [phone]   Fax:  [phone]  Name: Charles Townsend MRN: [id number] Date of Birth: [birth year]

## 2020-09-18 ENCOUNTER — Other Ambulatory Visit: Payer: Self-pay

## 2020-09-18 ENCOUNTER — Ambulatory Visit: Payer: Medicare Other

## 2020-09-18 DIAGNOSIS — R269 Unspecified abnormalities of gait and mobility: Secondary | ICD-10-CM

## 2020-09-18 DIAGNOSIS — R262 Difficulty in walking, not elsewhere classified: Secondary | ICD-10-CM

## 2020-09-18 DIAGNOSIS — M6281 Muscle weakness (generalized): Secondary | ICD-10-CM

## 2020-09-18 DIAGNOSIS — R278 Other lack of coordination: Secondary | ICD-10-CM

## 2020-09-18 NOTE — Therapy (Signed)
[place] MAIN [facility] SERVICES [address] [place], [place], [address] Phone: [phone]   Fax:  [phone]  Physical Therapy Treatment/Recertification for dates 9/14/-12/11/2020  Patient Details  Name: Charles Townsend MRN: [id number] Date of Birth: [birth year] Referring Provider (PT): Wonda Cerise   Encounter Date: 09/18/2020   PT End of Session - 09/18/20 0813     Visit Number 19    Number of Visits 31    Date for PT Re-Evaluation 12/11/20    Authorization Type 8/10 PN 07/31/20    Authorization Time Period Recert: [id number]    PT Start Time 0801    PT Stop Time 0844    PT Time Calculation (min) 43 min    Equipment Utilized During Treatment Gait belt    Activity Tolerance Patient tolerated treatment well    Behavior During Therapy [facility] for tasks assessed/performed             History reviewed. No pertinent past medical history.  History reviewed. No pertinent surgical history.  There were no vitals filed for this visit.   Subjective Assessment - 09/18/20 0810     Subjective Patient reports feeling a little off balance but doing well overall. He would like to continuing his PT to continue    Pertinent History Patient is a pleasant 80 year old who presents for balance and walking. Reports it has began about a year ago and has gotten progressively worse.  When he tries to walk he falls .Patient uses a cane mostly, stumbles frequently and falls 3x/month, normally forward.  PMH includes blindness in R eye, type II diabetes, HTN, HLD, stage 3a CKD, CHB, chronic renal failure, b12 deficiency, macular degeneration, hypercholesterolemia, PCO, cardiac pacemaker, PVCs, neuropathy. CT was clear with no evidence of acute intracranial abnormalities.    Limitations Lifting;Standing;Walking;House hold activities    How long can you sit comfortably? n/a    How long can you stand comfortably? 30 mins    How long can you walk comfortably? as  he walks he starts to drift    Patient Stated Goals increase his walking, decrease falls            Interventions   Patient goal: I want my balance to continue to improve and play golf if able.    Nustep L2 LE only x 5 min for ROM/cardiovascular endurance- VC to keep SPM > 60.   Assessement of goals:  FOTO= 54% DGI= 16/24  6 min walk: 910 feet with use of SPC and No Loss of balance.   Clinical Impression: Patient presents with improved dynamic balance as well as self perceived functional capabilities. He did not walk quite as far compared to last assessment however was able to complete the 6-minute walk test without any loss of balance today.  He remains motivated to continue to progress his balance and would like to be able to walk further in his community without loss of balance or rest break.  Added new goal to reflect patient's request. Patient's condition has the potential to improve in response to therapy. Maximum improvement is yet to be obtained. The anticipated improvement is attainable and reasonable in a generally predictable time.                         PT Education - 09/18/20 [phone]     Education Details plan of care; exercise technique    Person(s) Educated Patient    Methods Explanation;Demonstration;Tactile  cues;Verbal cues    Comprehension Verbalized understanding;Returned demonstration;Verbal cues required;Tactile cues required;Need further instruction              PT Short Term Goals - 07/31/20 0831       PT SHORT TERM GOAL #1   Title Patient will be independent in home exercise program to improve strength/mobility for better functional independence with ADLs    Baseline 6/22: HEP given 7/27: HEP compliant    Time 4    Period Weeks    Status Achieved    Target Date 07/24/20               PT Long Term Goals - 09/18/20 0829       PT LONG TERM GOAL #1   Title Patient will increase FOTO score to equal to or greater than  62%    to demonstrate statistically significant improvement in mobility and quality of life.    Baseline 6/22: 50% 7/27: 57% 9/14= 54%    Time 12    Period Weeks    Status On-going    Target Date 12/11/20      PT LONG TERM GOAL #2   Title Patient will increase Berg Balance score by > 6 points (42/56)  to demonstrate decreased fall risk during functional activities.    Baseline 6/22: 36/56 7/27: 45/56    Time 12    Period Weeks    Status Achieved      PT LONG TERM GOAL #3   Title Patient will increase six minute walk test distance to >1500 without LOB for progression to age norm community ambulator and improve gait ability    Baseline 6/22: 1100 ft with frequent LOB and use of SPC 7/27: 935 ft with SPC and multiple near LOB. 09/18/2020= 910 feet with use of SPC and no LOB today.    Time 12    Period Weeks    Status On-going      PT LONG TERM GOAL #4   Title Patient will increase dynamic gait index score to >19/24 as to demonstrate reduced fall risk and improved dynamic gait balance for better safety with community/home ambulation.    Baseline 6/22: 10/24 with cane 7/27: 13/24; 09/18/2020= 16/24    Time 12    Period Weeks    Status On-going      PT LONG TERM GOAL #5   Title Patient will ambulate 5 blocks or greater than 1500 feet using single-point cane, modified independent on all surfaces without rest break for loss of balance for improved community outings.    Baseline 09/18/2020-patient currently limited to around 900 feet of community distance with intermittent unsteadiness using cane.    Time 12    Period Weeks    Target Date 12/11/20                   Plan - 09/18/20 1713     Clinical Impression Statement Patient presents with improved dynamic balance as well as self perceived functional capabilities. He did not walk quite as far compared to last assessment however was able to complete the 6-minute walk test without any loss of balance today.  He remains motivated to  continue to progress his balance and would like to be able to walk further in his community without loss of balance or rest break.  Added new goal to reflect patient's request. Patient's condition has the potential to improve in response to therapy. Maximum improvement is yet to be obtained. The anticipated  improvement is attainable and reasonable in a generally predictable time.    Personal Factors and Comorbidities Age;Comorbidity 3+;Social Background;Time since onset of injury/illness/exacerbation;Fitness;Transportation    Comorbidities blindness in R eye, type II diabetes, HTN, HLD, stage 3a CKD, CHB, chronic renal failure, b12 deficiency, macular degeneration, hypercholesterolemia, PCO, cardiac pacemaker, PVCs, neuropathy    Examination-Activity Limitations Bed Mobility;Bend;Carry;Dressing;Reach Overhead;Locomotion Level;Lift;Squat;Stairs;Stand;Toileting;Transfers    Examination-Participation Restrictions Cleaning;Community Activity;Church;Driving;Meal Prep;Laundry;Shop;Volunteer;Yard Work    [employer] officer Evolving/Moderate complexity    Rehab Potential Good    PT Frequency 2x / week    PT Duration 12 weeks    PT Treatment/Interventions ADLs/Self Care Home Management;Biofeedback;Canalith Repostioning;Cryotherapy;Moist Heat;Traction;Ultrasound;Functional mobility training;Stair training;Gait training;DME Instruction;Therapeutic activities;Therapeutic exercise;Balance training;Neuromuscular re-education;Cognitive remediation;Manual techniques;Orthotic Fit/Training;Patient/family education;Passive range of motion;Dry needling;Vestibular;Taping;Energy conservation;Visual/perceptual remediation/compensation    PT Next Visit Plan Continue with progressive Balance and LE Strengthening training next visit.    PT Home Exercise Plan Access Code: [email or web address]  URL: [email or web address]    Consulted and Agree with Plan of Care Patient             Patient will benefit from  skilled therapeutic intervention in order to improve the following deficits and impairments:  Abnormal gait, Decreased activity tolerance, Decreased balance, Decreased endurance, Decreased coordination, Decreased knowledge of use of DME, Decreased mobility, Difficulty walking, Decreased strength, Increased edema, Impaired perceived functional ability, Impaired vision/preception, Postural dysfunction, Improper body mechanics  Visit Diagnosis: Abnormality of gait and mobility  Difficulty in walking, not elsewhere classified  Muscle weakness (generalized)  Other lack of coordination     Problem List There are no problems to display for this patient.   Lewis Moccasin, PT 09/19/2020, 7:42 AM  [place] MAIN [facility] SERVICES [address] [place], [place], [address] Phone: [phone]   Fax:  [phone]  Name: Charles Townsend MRN: [id number] Date of Birth: [birth year]

## 2020-09-19 ENCOUNTER — Ambulatory Visit: Payer: Medicare Other

## 2020-09-23 ENCOUNTER — Other Ambulatory Visit: Payer: Self-pay

## 2020-09-23 ENCOUNTER — Ambulatory Visit: Payer: Medicare Other

## 2020-09-23 DIAGNOSIS — R269 Unspecified abnormalities of gait and mobility: Secondary | ICD-10-CM | POA: Diagnosis not present

## 2020-09-23 DIAGNOSIS — R278 Other lack of coordination: Secondary | ICD-10-CM

## 2020-09-23 DIAGNOSIS — M6281 Muscle weakness (generalized): Secondary | ICD-10-CM

## 2020-09-23 DIAGNOSIS — R262 Difficulty in walking, not elsewhere classified: Secondary | ICD-10-CM

## 2020-09-23 NOTE — Therapy (Addendum)
[place] MAIN [facility] SERVICES [address] [place], [place], [address] Phone: [phone]   Fax:  [phone]  Physical Therapy Treatment/Physical Therapy Progress Note   Dates of reporting period  07/31/2020  to   09/23/2020  Patient Details  Name: Charles Townsend MRN: [id number] Date of Birth: Mar 13, 1940 Referring Provider (PT): Wonda Cerise   Encounter Date: 09/23/2020   PT End of Session - 09/23/20 0805     Visit Number 20    Number of Visits 31    Date for PT Re-Evaluation 12/11/20    Authorization Type 8/10 PN 07/31/20; PN on 09/23/2020    Authorization Time Period Recert: 05/31/76- 24/02/3534    PT Start Time 0802    PT Stop Time 0847    PT Time Calculation (min) 45 min    Equipment Utilized During Treatment Gait belt    Activity Tolerance Patient tolerated treatment well    Behavior During Therapy [facility] for tasks assessed/performed             History reviewed. No pertinent past medical history.  History reviewed. No pertinent surgical history.  There were no vitals filed for this visit.   Subjective Assessment - 09/23/20 0807     Subjective Patient reports doing okay so far this week- Denies any falls or pain over the weekend.    Pertinent History Patient is a pleasant 80 year old who presents for balance and walking. Reports it has began about a year ago and has gotten progressively worse.  When he tries to walk he falls .Patient uses a cane mostly, stumbles frequently and falls 3x/month, normally forward.  PMH includes blindness in R eye, type II diabetes, HTN, HLD, stage 3a CKD, CHB, chronic renal failure, b12 deficiency, macular degeneration, hypercholesterolemia, PCO, cardiac pacemaker, PVCs, neuropathy. CT was clear with no evidence of acute intracranial abnormalities.    Limitations Lifting;Standing;Walking;House hold activities    How long can you sit comfortably? n/a    How long can you stand comfortably? 30 mins     How long can you walk comfortably? as he walks he starts to drift    Patient Stated Goals increase his walking, decrease falls    Currently in Pain? No/denies               INTERVENTIONS:    Neuro Re-ed: close CGA with cues for safety awareness and body mechanics Airex pad - Static standing  without UE suppport x 30 sec x 2   Airex pad- A/P weight shift x 20 reps; lateral x 20 reps  Airex pad - small dynamic marching- Required 1 UE support- with retro lean contributing to majority of loss of balance.  Airex pad: PVC pipe: chest press 10x, straight arm raise 10x, twist 10x each direction  Rockerboard - A/P weight shifting- Difficulty with posterior- collapsing backward - unable to right on his own at times requiring min A and UE support Rockerboard- Lateral weight shifting x 20 followed by attempting to stand neutral 50/50 weight bearing- x 10 trials.     Static standing in corner: Chair in front of patient. Feet normal stance width- Eyes open x 20 sec x 2  Feet staggered with EO then closed x 10-20 sec each x 2-3 trials  Dynamic walking in hallway: -horizontal head turns with cues for reading sticky notes x 60 ft x 2 trials.    Education provided throughout session via VC/TC and demonstration to facilitate movement at target joints and  correct muscle activation for all  exercises performed.   Clinical Impression: Patient able to respond favorably to verbal cues and visual demo for balance activities. He was able to incorporate improved anterior weight shift. He continues to present with disequilibrium backward overall- able to weight shift anteriorly- but much more of an effort. Treatment focused on weight shifting activities and he was able to correct better with practice and cues today.Goals were all just addressed last visit- please refer to previous visit on 09/18/2020 for recent progress. Patient's condition has the potential to improve in response to therapy. Maximum  improvement is yet to be obtained. The anticipated improvement is attainable and reasonable in a generally predictable time.    Patient will benefit from skilled physical therapy to improve balance, gait mechanics, and overall mobility for decreased fall risk and improved quality of life.               PT Education - 09/23/20 0928     Education Details Exercise technique- Weight shifting/Posture    Person(s) Educated Patient    Methods Explanation;Demonstration;Tactile cues;Verbal cues    Comprehension Verbalized understanding;Returned demonstration;Verbal cues required;Tactile cues required;Need further instruction              PT Short Term Goals - 07/31/20 0831       PT SHORT TERM GOAL #1   Title Patient will be independent in home exercise program to improve strength/mobility for better functional independence with ADLs    Baseline 6/22: HEP given 7/27: HEP compliant    Time 4    Period Weeks    Status Achieved    Target Date 07/24/20               PT Long Term Goals - 09/18/20 0829       PT LONG TERM GOAL #1   Title Patient will increase FOTO score to equal to or greater than  62%   to demonstrate statistically significant improvement in mobility and quality of life.    Baseline 6/22: 50% 7/27: 57% 9/14= 54%    Time 12    Period Weeks    Status On-going    Target Date 12/11/20      PT LONG TERM GOAL #2   Title Patient will increase Berg Balance score by > 6 points (42/56)  to demonstrate decreased fall risk during functional activities.    Baseline 6/22: 36/56 7/27: 45/56    Time 12    Period Weeks    Status Achieved      PT LONG TERM GOAL #3   Title Patient will increase six minute walk test distance to >1500 without LOB for progression to age norm community ambulator and improve gait ability    Baseline 6/22: 1100 ft with frequent LOB and use of SPC 7/27: 935 ft with SPC and multiple near LOB. 09/18/2020= 910 feet with use of SPC and no LOB  today.    Time 12    Period Weeks    Status On-going      PT LONG TERM GOAL #4   Title Patient will increase dynamic gait index score to >19/24 as to demonstrate reduced fall risk and improved dynamic gait balance for better safety with community/home ambulation.    Baseline 6/22: 10/24 with cane 7/27: 13/24; 09/18/2020= 16/24    Time 12    Period Weeks    Status On-going      PT LONG TERM GOAL #5   Title Patient will ambulate 5  blocks or greater than 1500 feet using single-point cane, modified independent on all surfaces without rest break for loss of balance for improved community outings.    Baseline 09/18/2020-patient currently limited to around 900 feet of community distance with intermittent unsteadiness using cane.    Time 12    Period Weeks    Target Date 12/11/20                   Plan - 09/23/20 0805     Clinical Impression Statement Patient able to respond favorably to verbal cues and visual demo for balance activities. He was able to incorporate improved anterior weight shift. He continues to present with disequilibrium backward overall- able to weight shift anteriorly- but much more of an effort. Treatment focused on weight shifting activities and he was able to correct better with practice and cues today.Goals were all just addressed last visit- please refer to previous visit on 09/18/2020 for recent progress. Patient's condition has the potential to improve in response to therapy. Maximum improvement is yet to be obtained. The anticipated improvement is attainable and reasonable in a generally predictable time. Patient will benefit from skilled physical therapy to improve balance, gait mechanics, and overall mobility for decreased fall risk and improved quality of life.    Personal Factors and Comorbidities Age;Comorbidity 3+;Social Background;Time since onset of injury/illness/exacerbation;Fitness;Transportation    Comorbidities blindness in R eye, type II diabetes,  HTN, HLD, stage 3a CKD, CHB, chronic renal failure, b12 deficiency, macular degeneration, hypercholesterolemia, PCO, cardiac pacemaker, PVCs, neuropathy    Examination-Activity Limitations Bed Mobility;Bend;Carry;Dressing;Reach Overhead;Locomotion Level;Lift;Squat;Stairs;Stand;Toileting;Transfers    Examination-Participation Restrictions Cleaning;Community Activity;Church;Driving;Meal Prep;Laundry;Shop;Volunteer;Yard Work    [employer] officer Evolving/Moderate complexity    Rehab Potential Good    PT Frequency 2x / week    PT Duration 12 weeks    PT Treatment/Interventions ADLs/Self Care Home Management;Biofeedback;Canalith Repostioning;Cryotherapy;Moist Heat;Traction;Ultrasound;Functional mobility training;Stair training;Gait training;DME Instruction;Therapeutic activities;Therapeutic exercise;Balance training;Neuromuscular re-education;Cognitive remediation;Manual techniques;Orthotic Fit/Training;Patient/family education;Passive range of motion;Dry needling;Vestibular;Taping;Energy conservation;Visual/perceptual remediation/compensation    PT Next Visit Plan Continue with progressive Balance and LE Strengthening training next visit.    PT Home Exercise Plan Access Code: [email or web address]  URL: [email or web address]    Consulted and Agree with Plan of Care Patient             Patient will benefit from skilled therapeutic intervention in order to improve the following deficits and impairments:  Abnormal gait, Decreased activity tolerance, Decreased balance, Decreased endurance, Decreased coordination, Decreased knowledge of use of DME, Decreased mobility, Difficulty walking, Decreased strength, Increased edema, Impaired perceived functional ability, Impaired vision/preception, Postural dysfunction, Improper body mechanics  Visit Diagnosis: Abnormality of gait and mobility  Difficulty in walking, not elsewhere classified  Muscle weakness (generalized)  Other lack of  coordination     Problem List There are no problems to display for this patient.   Lewis Moccasin, PT 09/23/2020, 9:41 AM  [place] MAIN [facility] SERVICES [address] [place], [place], [address] Phone: [phone]   Fax:  [phone]  Name: Charles Townsend MRN: [id number] Date of Birth: [birth year]

## 2020-09-25 ENCOUNTER — Ambulatory Visit: Payer: Medicare Other

## 2020-09-25 ENCOUNTER — Other Ambulatory Visit: Payer: Self-pay

## 2020-09-25 DIAGNOSIS — R269 Unspecified abnormalities of gait and mobility: Secondary | ICD-10-CM | POA: Diagnosis not present

## 2020-09-25 DIAGNOSIS — R2681 Unsteadiness on feet: Secondary | ICD-10-CM

## 2020-09-25 DIAGNOSIS — R2689 Other abnormalities of gait and mobility: Secondary | ICD-10-CM

## 2020-09-25 DIAGNOSIS — R278 Other lack of coordination: Secondary | ICD-10-CM

## 2020-09-25 DIAGNOSIS — M6281 Muscle weakness (generalized): Secondary | ICD-10-CM

## 2020-09-25 DIAGNOSIS — R262 Difficulty in walking, not elsewhere classified: Secondary | ICD-10-CM

## 2020-09-25 NOTE — Therapy (Signed)
[place] MAIN [facility] SERVICES [address] [place], [place], [address] Phone: [phone]   Fax:  [phone]  Physical Therapy Treatment  Patient Details  Name: Charles Townsend MRN: [id number] Date of Birth: 30-Dec-1940 Referring Provider (PT): Wonda Cerise   Encounter Date: 09/25/2020   PT End of Session - 09/25/20 0817     Visit Number 21    Number of Visits 31    Date for PT Re-Evaluation 12/11/20    Authorization Type 8/10 PN 07/31/20; PN on 09/23/2020    Authorization Time Period Recert: 3/76/28- 31/05/1759    PT Start Time 0806    PT Stop Time 0844    PT Time Calculation (min) 38 min    Equipment Utilized During Treatment Gait belt    Activity Tolerance Patient tolerated treatment well    Behavior During Therapy [facility] for tasks assessed/performed             No past medical history on file.  No past surgical history on file.  There were no vitals filed for this visit.   Subjective Assessment - 09/25/20 0810     Subjective Pt reports he is doing well in general, no falls since last visits. Reports his cardiologist put him on a reflux med. No other updates    Pertinent History Patient is a pleasant 80 year old who presents for balance and walking. Reports it has began about a year ago and has gotten progressively worse.  When he tries to walk he falls .Patient uses a cane mostly, stumbles frequently and falls 3x/month, normally forward.  PMH includes blindness in R eye, type II diabetes, HTN, HLD, stage 3a CKD, CHB, chronic renal failure, b12 deficiency, macular degeneration, hypercholesterolemia, PCO, cardiac pacemaker, PVCs, neuropathy. CT was clear with no evidence of acute intracranial abnormalities.    Currently in Pain? No/denies             INTERVENTIONS:  Neuro Re-ed: close CGA with cues for safety awareness and body mechanics Airex pad - Static standing without UE suppport x 30 sec x 2  Airex pad - eyes closed  16x3sec, min-modA for recovery (able to perform 1x without LOB)  Airex pad- A/P weight shift x 20 reps; lateral x 20 reps (wide stance on 2 pads, cued for crouch stance)  Wall pushups to promote sagittal plane weight shifting on treadmill bar x20 (   *applied 7.5lb backpack load to promote postural adjustment AMB in room 63ft with 7.5lb backpack load (SPC)  Firm surface - small dynamic marching- Required 1 UE support- with retro lean contributing to majority of loss of balance. Firm surface PVC pipe: chest press 20x -seated in chair deadlift (plams to floor, return to neutral) to promote improved trunk/back postural proprioception x20  -RDL unloaded, bilat hands to author's stool at lowest setting x20       PT Education - 09/25/20 0816     Education Details Reviewed exercises and roll of rehab actiivty    Person(s) Educated Patient    Methods Explanation;Demonstration    Comprehension Verbalized understanding;Returned demonstration;Verbal cues required              PT Short Term Goals - 07/31/20 0831       PT SHORT TERM GOAL #1   Title Patient will be independent in home exercise program to improve strength/mobility for better functional independence with ADLs    Baseline 6/22: HEP given 7/27: HEP compliant    Time 4  Period Weeks    Status Achieved    Target Date 07/24/20               PT Long Term Goals - 09/18/20 0829       PT LONG TERM GOAL #1   Title Patient will increase FOTO score to equal to or greater than  62%   to demonstrate statistically significant improvement in mobility and quality of life.    Baseline 6/22: 50% 7/27: 57% 9/14= 54%    Time 12    Period Weeks    Status On-going    Target Date 12/11/20      PT LONG TERM GOAL #2   Title Patient will increase Berg Balance score by > 6 points (42/56)  to demonstrate decreased fall risk during functional activities.    Baseline 6/22: 36/56 7/27: 45/56    Time 12    Period Weeks    Status  Achieved      PT LONG TERM GOAL #3   Title Patient will increase six minute walk test distance to >1500 without LOB for progression to age norm community ambulator and improve gait ability    Baseline 6/22: 1100 ft with frequent LOB and use of SPC 7/27: 935 ft with SPC and multiple near LOB. 09/18/2020= 910 feet with use of SPC and no LOB today.    Time 12    Period Weeks    Status On-going      PT LONG TERM GOAL #4   Title Patient will increase dynamic gait index score to >19/24 as to demonstrate reduced fall risk and improved dynamic gait balance for better safety with community/home ambulation.    Baseline 6/22: 10/24 with cane 7/27: 13/24; 09/18/2020= 16/24    Time 12    Period Weeks    Status On-going      PT LONG TERM GOAL #5   Title Patient will ambulate 5 blocks or greater than 1500 feet using single-point cane, modified independent on all surfaces without rest break for loss of balance for improved community outings.    Baseline 09/18/2020-patient currently limited to around 900 feet of community distance with intermittent unsteadiness using cane.    Time 12    Period Weeks    Target Date 12/11/20                   Plan - 09/25/20 0823     Clinical Impression Statement Continue to work on static and dynamic postural control.  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  Heavy verbal cues to utilize a more crouched posture when appropriate, also performed lots of active and passive range of motion of the trunk in the sagittal plane and standing and sitting situations.  Patient is able to demonstrate an natural anterior weight shift of the trunk when a backpack load is applied to the shoulders, however he continues to default to a slightly more posterior position.  Patient continues to make progress towards goals of treatment however he does continue to require very close min to mod assist  to perform these interventions due to frequent loss of balance and delayed awareness of postural sway.   Personal Factors and Comorbidities Age;Comorbidity 3+;Social Background;Time since onset of injury/illness/exacerbation;Fitness;Transportation    Comorbidities blindness in R eye, type II diabetes, HTN, HLD, stage 3a CKD, CHB, chronic renal failure, b12 deficiency, macular degeneration, hypercholesterolemia, PCO, cardiac pacemaker, PVCs, neuropathy  Examination-Activity Limitations Bed Mobility;Bend;Carry;Dressing;Reach Overhead;Locomotion Level;Lift;Squat;Stairs;Stand;Toileting;Transfers    Examination-Participation Restrictions Cleaning;Community Activity;Church;Driving;Meal Prep;Laundry;Shop;Volunteer;Yard Work    [employer] officer Evolving/Moderate complexity    Clinical Decision Making Moderate    Rehab Potential Good    PT Frequency 2x / week    PT Duration 12 weeks    PT Treatment/Interventions ADLs/Self Care Home Management;Biofeedback;Canalith Repostioning;Cryotherapy;Moist Heat;Traction;Ultrasound;Functional mobility training;Stair training;Gait training;DME Instruction;Therapeutic activities;Therapeutic exercise;Balance training;Neuromuscular re-education;Cognitive remediation;Manual techniques;Orthotic Fit/Training;Patient/family education;Passive range of motion;Dry needling;Vestibular;Taping;Energy conservation;Visual/perceptual remediation/compensation    PT Next Visit Plan Continue with progressive Balance and LE Strengthening training next visit.    PT Home Exercise Plan Access Code: [email or web address]  URL: [email or web address]    Consulted and Agree with Plan of Care Patient             Patient will benefit from skilled therapeutic intervention in order to improve the following deficits and impairments:  Abnormal gait, Decreased activity tolerance, Decreased balance, Decreased endurance, Decreased coordination, Decreased knowledge of use of DME,  Decreased mobility, Difficulty walking, Decreased strength, Increased edema, Impaired perceived functional ability, Impaired vision/preception, Postural dysfunction, Improper body mechanics  Visit Diagnosis: Abnormality of gait and mobility  Difficulty in walking, not elsewhere classified  Muscle weakness (generalized)  Other lack of coordination  Other abnormalities of gait and mobility  Unsteadiness on feet     Problem List There are no problems to display for this patient.  Note: Portions of this document were prepared using Dragon voice recognition software and although reviewed may contain unintentional dictation errors in syntax, grammar, or spelling.   Secilia Apps C, PT 09/25/2020, 8:39 AM  8:53 AM, 09/25/20 Etta Grandchild, PT, DPT Physical Therapist - [facility]  Outpatient Physical Therapy- [place] MAIN [facility] SERVICES [address] [place], [place], [address] Phone: [phone]   Fax:  [phone]  Name: Khalen Styer MRN: [id number] Date of Birth: Aug 17, 1940

## 2020-09-26 ENCOUNTER — Ambulatory Visit: Payer: Medicare Other

## 2020-09-30 ENCOUNTER — Ambulatory Visit: Payer: Medicare Other

## 2020-09-30 ENCOUNTER — Other Ambulatory Visit: Payer: Self-pay

## 2020-09-30 DIAGNOSIS — M6281 Muscle weakness (generalized): Secondary | ICD-10-CM

## 2020-09-30 DIAGNOSIS — R262 Difficulty in walking, not elsewhere classified: Secondary | ICD-10-CM

## 2020-09-30 DIAGNOSIS — R2689 Other abnormalities of gait and mobility: Secondary | ICD-10-CM

## 2020-09-30 DIAGNOSIS — R269 Unspecified abnormalities of gait and mobility: Secondary | ICD-10-CM

## 2020-09-30 DIAGNOSIS — R2681 Unsteadiness on feet: Secondary | ICD-10-CM

## 2020-09-30 DIAGNOSIS — R278 Other lack of coordination: Secondary | ICD-10-CM

## 2020-09-30 NOTE — Therapy (Signed)
[place] MAIN [facility] SERVICES [address] [place], [place], [address] Phone: [phone]   Fax:  [phone]  Physical Therapy Treatment  Patient Details  Name: Charles Townsend MRN: [id number] Date of Birth: 12/21/1940 Referring Provider (PT): Wonda Cerise   Encounter Date: 09/30/2020   PT End of Session - 09/30/20 0823     Visit Number 22    Number of Visits 31    Date for PT Re-Evaluation 12/11/20    Authorization Time Period Recert: 02/07/52- 27/0/6237    PT Start Time 0810    PT Stop Time 0850    PT Time Calculation (min) 40 min    Equipment Utilized During Treatment Gait belt    Activity Tolerance Patient tolerated treatment well    Behavior During Therapy [facility] for tasks assessed/performed             No past medical history on file.  No past surgical history on file.  There were no vitals filed for this visit.   Subjective Assessment - 09/30/20 0817     Subjective Pt doing well today in general. Reports a good vweekend, no notable updates. No falls. Protonix continues to cause no obvious adverse side effects.    Pertinent History Patient is a pleasant 80 year old who presents for balance and walking. Reports it has began about a year ago and has gotten progressively worse.  When he tries to walk he falls .Patient uses a cane mostly, stumbles frequently and falls 3x/month, normally forward.  PMH includes blindness in R eye, type II diabetes, HTN, HLD, stage 3a CKD, CHB, chronic renal failure, b12 deficiency, macular degeneration, hypercholesterolemia, PCO, cardiac pacemaker, PVCs, neuropathy. CT was clear with no evidence of acute intracranial abnormalities.    Currently in Pain? No/denies             INTERVENTION THIS DATE:  -AMB overground warmup, SPC in RUE sequenced with LLE 452ft  *increased wide based ataxia, sway toward end, nearly wipes out trying to get to chair in a hurry seated recovery interval    Vitals  assessment 152/87   78  100bpm (seated)  149/94  85bpm (standing) 168/88 86bpm (standing x 60sec)  -AMB overground as above, vitals checked in standing at end of 461ft 158/81 mmHg post AMB 451ft 97%, 94 bpm (SOB 2 /4)   -rainbow ball dead lift to stool then to overhead lift x10, blue weighted ball x 10  seated recovery interval    -Weighted ball deadlift x10   seated recovery interval    -5xSTS elevated surface hands free 10xSTS elevtaed surface hands free, eyes closed (minguard assist)   seated recovery interval    -5xSTS with Rt airex, Left 3" step, then 5x steps opposite (eyes open) Large LOB 1 time, several other small LOB   -same as above but eyes closed (pt requests attempt) performance similar, but less predictable due to variable righting responses.      PT Education - 09/30/20 0822     Education Details safety with activity in clinmic    Person(s) Educated Patient    Methods Explanation;Demonstration    Comprehension Verbalized understanding;Returned demonstration              PT Short Term Goals - 07/31/20 0831       PT SHORT TERM GOAL #1   Title Patient will be independent in home exercise program to improve strength/mobility for better functional independence with ADLs    Baseline 6/22:  HEP given 7/27: HEP compliant    Time 4    Period Weeks    Status Achieved    Target Date 07/24/20               PT Long Term Goals - 09/18/20 0829       PT LONG TERM GOAL #1   Title Patient will increase FOTO score to equal to or greater than  62%   to demonstrate statistically significant improvement in mobility and quality of life.    Baseline 6/22: 50% 7/27: 57% 9/14= 54%    Time 12    Period Weeks    Status On-going    Target Date 12/11/20      PT LONG TERM GOAL #2   Title Patient will increase Berg Balance score by > 6 points (42/56)  to demonstrate decreased fall risk during functional activities.    Baseline 6/22: 36/56 7/27: 45/56    Time 12     Period Weeks    Status Achieved      PT LONG TERM GOAL #3   Title Patient will increase six minute walk test distance to >1500 without LOB for progression to age norm community ambulator and improve gait ability    Baseline 6/22: 1100 ft with frequent LOB and use of SPC 7/27: 935 ft with SPC and multiple near LOB. 09/18/2020= 910 feet with use of SPC and no LOB today.    Time 12    Period Weeks    Status On-going      PT LONG TERM GOAL #4   Title Patient will increase dynamic gait index score to >19/24 as to demonstrate reduced fall risk and improved dynamic gait balance for better safety with community/home ambulation.    Baseline 6/22: 10/24 with cane 7/27: 13/24; 09/18/2020= 16/24    Time 12    Period Weeks    Status On-going      PT LONG TERM GOAL #5   Title Patient will ambulate 5 blocks or greater than 1500 feet using single-point cane, modified independent on all surfaces without rest break for loss of balance for improved community outings.    Baseline 09/18/2020-patient currently limited to around 900 feet of community distance with intermittent unsteadiness using cane.    Time 12    Period Weeks    Target Date 12/11/20                   Plan - 09/30/20 0824     Clinical Impression Statement Continue to work on postural trunk proprioception and motor control both in standing and with positional changes.  Patient continues to have difficulty with postural extension and posterior sway.  Patient also demonstrates delayed awareness of postural sway resultant and delayed righting responses that on occasion are in adequate for balance recovery without significant physical assistance from author. Patient remains calm and motivated to advance his progress and improved balance overall. 2 walking intervals performed in session today, both demonstrative of increased gait ataxia over time, significant increased lateral drifting, and patient has increased DOE.  Vitals check after  second effort unrevealing of any obvious contributors, heart rate within normal limits, rhythm is regular, good oxygen saturation on room air, no signs of exercise-induced hypotension.  At this time curious about peripheral components to postural difficulty as his patterns are somewhat similar to those seen in lumbar spinal stenosis, also an advanced pedal neuropathy.   Personal Factors and Comorbidities Age;Comorbidity 3+;Social Background;Time since onset of injury/illness/exacerbation;Fitness;Transportation  Comorbidities blindness in R eye, type II diabetes, HTN, HLD, stage 3a CKD, CHB, chronic renal failure, b12 deficiency, macular degeneration, hypercholesterolemia, PCO, cardiac pacemaker, PVCs, neuropathy    Examination-Activity Limitations Bed Mobility;Bend;Carry;Dressing;Reach Overhead;Locomotion Level;Lift;Squat;Stairs;Stand;Toileting;Transfers    Examination-Participation Restrictions Cleaning;Community Activity;Church;Driving;Meal Prep;Laundry;Shop;Volunteer;Yard Work    [employer] officer Evolving/Moderate complexity    Clinical Decision Making Moderate    Rehab Potential Good    PT Frequency 2x / week    PT Duration 12 weeks    PT Treatment/Interventions ADLs/Self Care Home Management;Biofeedback;Canalith Repostioning;Cryotherapy;Moist Heat;Traction;Ultrasound;Functional mobility training;Stair training;Gait training;DME Instruction;Therapeutic activities;Therapeutic exercise;Balance training;Neuromuscular re-education;Cognitive remediation;Manual techniques;Orthotic Fit/Training;Patient/family education;Passive range of motion;Dry needling;Vestibular;Taping;Energy conservation;Visual/perceptual remediation/compensation    PT Next Visit Plan Continue with progressive Balance and LE Strengthening training next visit.    PT Home Exercise Plan Access Code: [email or web address]  URL: [email or web address]    Consulted and Agree with Plan of Care Patient              Patient will benefit from skilled therapeutic intervention in order to improve the following deficits and impairments:  Abnormal gait, Decreased activity tolerance, Decreased balance, Decreased endurance, Decreased coordination, Decreased knowledge of use of DME, Decreased mobility, Difficulty walking, Decreased strength, Increased edema, Impaired perceived functional ability, Impaired vision/preception, Postural dysfunction, Improper body mechanics  Visit Diagnosis: Abnormality of gait and mobility  Difficulty in walking, not elsewhere classified  Muscle weakness (generalized)  Other lack of coordination  Other abnormalities of gait and mobility  Unsteadiness on feet     Problem List There are no problems to display for this patient.  9:03 AM, 09/30/20 Etta Grandchild, PT, DPT Physical Therapist - [facility]  Outpatient Physical Therapy- [place] [phone]     [place], [place] 09/30/2020, 8:25 AM  [place] MAIN [facility] SERVICES [address] [place], [place], [address] Phone: [phone]   Fax:  [phone]  Name: Zach Tietje MRN: [id number] Date of Birth: 17-Mar-1940

## 2020-10-02 ENCOUNTER — Other Ambulatory Visit: Payer: Self-pay

## 2020-10-02 ENCOUNTER — Ambulatory Visit: Payer: Medicare Other

## 2020-10-02 DIAGNOSIS — M6281 Muscle weakness (generalized): Secondary | ICD-10-CM

## 2020-10-02 DIAGNOSIS — R262 Difficulty in walking, not elsewhere classified: Secondary | ICD-10-CM

## 2020-10-02 DIAGNOSIS — R269 Unspecified abnormalities of gait and mobility: Secondary | ICD-10-CM

## 2020-10-02 DIAGNOSIS — R2681 Unsteadiness on feet: Secondary | ICD-10-CM

## 2020-10-02 NOTE — Therapy (Signed)
[place] MAIN [facility] SERVICES [address] [place], [place], [address] Phone: [phone]   Fax:  [phone]  Physical Therapy Treatment  Patient Details  Name: Charles Townsend MRN: [id number] Date of Birth: 1940/07/03 Referring Provider (PT): Wonda Cerise   Encounter Date: 10/02/2020   PT End of Session - 10/02/20 0809     Visit Number 23    Number of Visits 31    Date for PT Re-Evaluation 12/11/20    Authorization Time Period Recert: 2/83/15- 17/06/1605    PT Start Time 0804    PT Stop Time 0844    PT Time Calculation (min) 40 min    Equipment Utilized During Treatment Gait belt    Activity Tolerance Patient tolerated treatment well    Behavior During Therapy [facility] for tasks assessed/performed             History reviewed. No pertinent past medical history.  History reviewed. No pertinent surgical history.  There were no vitals filed for this visit.   Subjective Assessment - 10/02/20 0808     Subjective Patient reports feeling about the same. Denies any falls.    Pertinent History Patient is a pleasant 80 year old who presents for balance and walking. Reports it has began about a year ago and has gotten progressively worse.  When he tries to walk he falls .Patient uses a cane mostly, stumbles frequently and falls 3x/month, normally forward.  PMH includes blindness in R eye, type II diabetes, HTN, HLD, stage 3a CKD, CHB, chronic renal failure, b12 deficiency, macular degeneration, hypercholesterolemia, PCO, cardiac pacemaker, PVCs, neuropathy. CT was clear with no evidence of acute intracranial abnormalities.    Currently in Pain? No/denies               INTERVENTIONS:  Neuromuscular re-education:  On Blue airex pad:  Dynamic high marching (attempted without UE support yet increased unsteadiness- performed approx 20 reps each leg with intermittent UE support)  Dynamic small amplitude marching x 25 reps BLE Dynamic A/P  weight shifting - x 25 each direction- patient with increased retro lean- requiring min/mod A to keep from falling backward.   Static standing x 20 sec - Some retro lean - unable to correct at times with ankle/hip strategy.   On firm surface:   Dynamic high march x 20 reps Dynamic small amp march x 20 reps Dynamic A/P weight shifting x 25 reps- Retro lean not as significant as standing on firm surface.  Static standing- x 25 sec without LOB.   Pertubations: Ant/Post/Lateral- at shoulder and waist- Intermittent LOB with larger amp push and with quick push.   Reactive postural control Ant: Patient leaned forward beyond LOS and able to use step reaction 5/5 times. Post: Patient leaned backward beyond LOS and able to use step reaction 3/7 times.   Static stand on airex balance beam: Attempting to hold 5-15 sec (increased retro lean requiring min assist at times)   Clinical Impression: Patient was again challenged with all static and dynamic balance activities today. He mostly presented with increased retro lean with all activities and not always able to correct with ankle/hip/step strategy. He did improve some with practice. Emphasized HEP (balance program performed in corner of home) for max carryover and patient verbalized understanding. Patient will benefit from skilled physical therapy to improve balance, gait mechanics, and overall mobility for decreased fall risk and improved quality of life.  PT Education - 10/02/20 2233     Education Details Balance education    Person(s) Educated Patient    Methods Explanation;Demonstration;Tactile cues;Verbal cues    Comprehension Verbalized understanding;Returned demonstration;Verbal cues required;Need further instruction;Tactile cues required              PT Short Term Goals - 07/31/20 0831       PT SHORT TERM GOAL #1   Title Patient will be independent in home exercise program to improve  strength/mobility for better functional independence with ADLs    Baseline 6/22: HEP given 7/27: HEP compliant    Time 4    Period Weeks    Status Achieved    Target Date 07/24/20               PT Long Term Goals - 09/18/20 0829       PT LONG TERM GOAL #1   Title Patient will increase FOTO score to equal to or greater than  62%   to demonstrate statistically significant improvement in mobility and quality of life.    Baseline 6/22: 50% 7/27: 57% 9/14= 54%    Time 12    Period Weeks    Status On-going    Target Date 12/11/20      PT LONG TERM GOAL #2   Title Patient will increase Berg Balance score by > 6 points (42/56)  to demonstrate decreased fall risk during functional activities.    Baseline 6/22: 36/56 7/27: 45/56    Time 12    Period Weeks    Status Achieved      PT LONG TERM GOAL #3   Title Patient will increase six minute walk test distance to >1500 without LOB for progression to age norm community ambulator and improve gait ability    Baseline 6/22: 1100 ft with frequent LOB and use of SPC 7/27: 935 ft with SPC and multiple near LOB. 09/18/2020= 910 feet with use of SPC and no LOB today.    Time 12    Period Weeks    Status On-going      PT LONG TERM GOAL #4   Title Patient will increase dynamic gait index score to >19/24 as to demonstrate reduced fall risk and improved dynamic gait balance for better safety with community/home ambulation.    Baseline 6/22: 10/24 with cane 7/27: 13/24; 09/18/2020= 16/24    Time 12    Period Weeks    Status On-going      PT LONG TERM GOAL #5   Title Patient will ambulate 5 blocks or greater than 1500 feet using single-point cane, modified independent on all surfaces without rest break for loss of balance for improved community outings.    Baseline 09/18/2020-patient currently limited to around 900 feet of community distance with intermittent unsteadiness using cane.    Time 12    Period Weeks    Target Date 12/11/20                    Plan - 10/02/20 0810     Clinical Impression Statement Patient was again challenged with all static and dynamic balance activities today. He mostly presented with increased retro lean with all activities and not always able to correct with ankle/hip/step strategy. He did improve some with practice. Emphasized HEP (balance program performed in corner of home) for max carryover and patient verbalized understanding. Patient will benefit from skilled physical therapy to improve balance, gait mechanics, and overall mobility for decreased fall risk  and improved quality of life.    Personal Factors and Comorbidities Age;Comorbidity 3+;Social Background;Time since onset of injury/illness/exacerbation;Fitness;Transportation    Comorbidities blindness in R eye, type II diabetes, HTN, HLD, stage 3a CKD, CHB, chronic renal failure, b12 deficiency, macular degeneration, hypercholesterolemia, PCO, cardiac pacemaker, PVCs, neuropathy    Examination-Activity Limitations Bed Mobility;Bend;Carry;Dressing;Reach Overhead;Locomotion Level;Lift;Squat;Stairs;Stand;Toileting;Transfers    Examination-Participation Restrictions Cleaning;Community Activity;Church;Driving;Meal Prep;Laundry;Shop;Volunteer;Yard Work    [employer] officer Evolving/Moderate complexity    Rehab Potential Good    PT Frequency 2x / week    PT Duration 12 weeks    PT Treatment/Interventions ADLs/Self Care Home Management;Biofeedback;Canalith Repostioning;Cryotherapy;Moist Heat;Traction;Ultrasound;Functional mobility training;Stair training;Gait training;DME Instruction;Therapeutic activities;Therapeutic exercise;Balance training;Neuromuscular re-education;Cognitive remediation;Manual techniques;Orthotic Fit/Training;Patient/family education;Passive range of motion;Dry needling;Vestibular;Taping;Energy conservation;Visual/perceptual remediation/compensation    PT Next Visit Plan Continue with progressive Balance and LE  Strengthening training next visit.    PT Home Exercise Plan Access Code: [email or web address]  URL: [email or web address]    Consulted and Agree with Plan of Care Patient             Patient will benefit from skilled therapeutic intervention in order to improve the following deficits and impairments:  Abnormal gait, Decreased activity tolerance, Decreased balance, Decreased endurance, Decreased coordination, Decreased knowledge of use of DME, Decreased mobility, Difficulty walking, Decreased strength, Increased edema, Impaired perceived functional ability, Impaired vision/preception, Postural dysfunction, Improper body mechanics  Visit Diagnosis: Abnormality of gait and mobility  Difficulty in walking, not elsewhere classified  Muscle weakness (generalized)  Unsteadiness on feet     Problem List There are no problems to display for this patient.   Lewis Moccasin, PT 10/02/2020, 10:46 PM  [place] MAIN [facility] SERVICES [address] [place], [place], [address] Phone: [phone]   Fax:  [phone]  Name: Charles Townsend MRN: [id number] Date of Birth: 03-May-1940

## 2020-10-03 ENCOUNTER — Ambulatory Visit: Payer: Medicare Other

## 2020-10-07 ENCOUNTER — Ambulatory Visit: Payer: Medicare Other

## 2020-10-09 ENCOUNTER — Other Ambulatory Visit: Payer: Self-pay

## 2020-10-09 ENCOUNTER — Ambulatory Visit: Payer: Medicare Other | Attending: Physician Assistant

## 2020-10-09 DIAGNOSIS — R2681 Unsteadiness on feet: Secondary | ICD-10-CM | POA: Insufficient documentation

## 2020-10-09 DIAGNOSIS — R262 Difficulty in walking, not elsewhere classified: Secondary | ICD-10-CM | POA: Insufficient documentation

## 2020-10-09 DIAGNOSIS — R269 Unspecified abnormalities of gait and mobility: Secondary | ICD-10-CM | POA: Diagnosis not present

## 2020-10-09 DIAGNOSIS — M6281 Muscle weakness (generalized): Secondary | ICD-10-CM | POA: Insufficient documentation

## 2020-10-09 NOTE — Therapy (Signed)
[place] MAIN [facility] SERVICES [address] [place], [place], [address] Phone: [phone]   Fax:  [phone]  Physical Therapy Treatment  Patient Details  Name: Charles Townsend MRN: [id number] Date of Birth: Sep 09, 1940 Referring Provider (PT): Wonda Cerise   Encounter Date: 10/09/2020   PT End of Session - 10/09/20 0807     Visit Number 24    Number of Visits 31    Date for PT Re-Evaluation 12/11/20    Authorization Time Period Recert: 01/11/24- 94/08/5460    PT Start Time 0803    PT Stop Time 0847    PT Time Calculation (min) 44 min    Equipment Utilized During Treatment Gait belt    Activity Tolerance Patient tolerated treatment well    Behavior During Therapy [facility] for tasks assessed/performed             History reviewed. No pertinent past medical history.  History reviewed. No pertinent surgical history.  There were no vitals filed for this visit.   Subjective Assessment - 10/09/20 0806     Subjective Patient reports missing Monday due to not feeling well with low blood sugar <75. Today I am okay with BG of 135.    Pertinent History Patient is a pleasant 80 year old who presents for balance and walking. Reports it has began about a year ago and has gotten progressively worse.  When he tries to walk he falls .Patient uses a cane mostly, stumbles frequently and falls 3x/month, normally forward.  PMH includes blindness in R eye, type II diabetes, HTN, HLD, stage 3a CKD, CHB, chronic renal failure, b12 deficiency, macular degeneration, hypercholesterolemia, PCO, cardiac pacemaker, PVCs, neuropathy. CT was clear with no evidence of acute intracranial abnormalities.    Currently in Pain? No/denies                INTERVENTIONS:    Neuromuscular re-education:  On Blue airex pad:  Dynamic high marching (Without UE support yet increased unsteadiness with more anterior loss of balance today - performed approx 25 reps each leg )   Dynamic small amplitude marching x 25 reps BLE (no UE support)  Dynamic A/P weight shifting - x 25 each direction- patient with minimal retro lean today.   Static standing x 20 sec - minimal sway yet no LOB and able to use mostly ankle strategies.   On firm surface:   Dynamic high march x 20 reps Side stepping in // bars -focusing on taking a wide step Forward/backward walking- More difficulty with retro steps- VC to try to take a longer step.   Pertubations: Ant/Post/Lateral- at shoulder and waist- Ionly minimal LOB with larger amp push and with quick push.   Reactive postural control Ant: Patient leaned forward beyond LOS and able to use step reaction 5/5 times. Post: Patient leaned backward beyond LOS and able to use step reaction 5/7 times.   Static stand in 3/4 Tandem position: Attempting to hold 5-20 sec (increased unsteadiness at times requiring correction with UE Support- able to regroup) x multiple attempts- progressing to 20 reps.  Education provided throughout session via VC/TC and demonstration to facilitate movement at target joints and correct muscle activation for all testing and exercises performed.   Clinical Impression: Patient presented with much improved steadiness and righting reactions- Ankle, hip, step strategies versus previous session. He only exhibited minimal retro lean today and able to perform all balance activities with CGA. Patient was able to follow all balance verbal,  tactile cues well today and exhibited excellent motivation. Patient will benefit from skilled physical therapy to improve balance, gait mechanics, and overall mobility for decreased fall risk and improved quality of life.                      PT Education - 10/09/20 0807     Education Details Balance education    Person(s) Educated Patient    Methods Explanation;Demonstration;Tactile cues;Verbal cues    Comprehension Verbalized understanding;Returned demonstration;Verbal  cues required;Need further instruction;Tactile cues required              PT Short Term Goals - 07/31/20 0831       PT SHORT TERM GOAL #1   Title Patient will be independent in home exercise program to improve strength/mobility for better functional independence with ADLs    Baseline 6/22: HEP given 7/27: HEP compliant    Time 4    Period Weeks    Status Achieved    Target Date 07/24/20               PT Long Term Goals - 09/18/20 0829       PT LONG TERM GOAL #1   Title Patient will increase FOTO score to equal to or greater than  62%   to demonstrate statistically significant improvement in mobility and quality of life.    Baseline 6/22: 50% 7/27: 57% 9/14= 54%    Time 12    Period Weeks    Status On-going    Target Date 12/11/20      PT LONG TERM GOAL #2   Title Patient will increase Berg Balance score by > 6 points (42/56)  to demonstrate decreased fall risk during functional activities.    Baseline 6/22: 36/56 7/27: 45/56    Time 12    Period Weeks    Status Achieved      PT LONG TERM GOAL #3   Title Patient will increase six minute walk test distance to >1500 without LOB for progression to age norm community ambulator and improve gait ability    Baseline 6/22: 1100 ft with frequent LOB and use of SPC 7/27: 935 ft with SPC and multiple near LOB. 09/18/2020= 910 feet with use of SPC and no LOB today.    Time 12    Period Weeks    Status On-going      PT LONG TERM GOAL #4   Title Patient will increase dynamic gait index score to >19/24 as to demonstrate reduced fall risk and improved dynamic gait balance for better safety with community/home ambulation.    Baseline 6/22: 10/24 with cane 7/27: 13/24; 09/18/2020= 16/24    Time 12    Period Weeks    Status On-going      PT LONG TERM GOAL #5   Title Patient will ambulate 5 blocks or greater than 1500 feet using single-point cane, modified independent on all surfaces without rest break for loss of balance for  improved community outings.    Baseline 09/18/2020-patient currently limited to around 900 feet of community distance with intermittent unsteadiness using cane.    Time 12    Period Weeks    Target Date 12/11/20                   Plan - 10/09/20 0808     Clinical Impression Statement Patient presented with much improved steadiness and righting reactions- Ankle, hip, step strategies versus previous session. He only exhibited  minimal retro lean today and able to perform all balance activities with CGA. Patient was able to follow all balance verbal, tactile cues well today and exhibited excellent motivation. Patient will benefit from skilled physical therapy to improve balance, gait mechanics, and overall mobility for decreased fall risk and improved quality of life    Personal Factors and Comorbidities Age;Comorbidity 3+;Social Background;Time since onset of injury/illness/exacerbation;Fitness;Transportation    Comorbidities blindness in R eye, type II diabetes, HTN, HLD, stage 3a CKD, CHB, chronic renal failure, b12 deficiency, macular degeneration, hypercholesterolemia, PCO, cardiac pacemaker, PVCs, neuropathy    Examination-Activity Limitations Bed Mobility;Bend;Carry;Dressing;Reach Overhead;Locomotion Level;Lift;Squat;Stairs;Stand;Toileting;Transfers    Examination-Participation Restrictions Cleaning;Community Activity;Church;Driving;Meal Prep;Laundry;Shop;Volunteer;Yard Work    [employer] officer Evolving/Moderate complexity    Rehab Potential Good    PT Frequency 2x / week    PT Duration 12 weeks    PT Treatment/Interventions ADLs/Self Care Home Management;Biofeedback;Canalith Repostioning;Cryotherapy;Moist Heat;Traction;Ultrasound;Functional mobility training;Stair training;Gait training;DME Instruction;Therapeutic activities;Therapeutic exercise;Balance training;Neuromuscular re-education;Cognitive remediation;Manual techniques;Orthotic Fit/Training;Patient/family  education;Passive range of motion;Dry needling;Vestibular;Taping;Energy conservation;Visual/perceptual remediation/compensation    PT Next Visit Plan Continue with progressive Balance and LE Strengthening training next visit.    PT Home Exercise Plan Access Code: [email or web address]  URL: [email or web address]; No updates this visit    Consulted and Agree with Plan of Care Patient             Patient will benefit from skilled therapeutic intervention in order to improve the following deficits and impairments:  Abnormal gait, Decreased activity tolerance, Decreased balance, Decreased endurance, Decreased coordination, Decreased knowledge of use of DME, Decreased mobility, Difficulty walking, Decreased strength, Increased edema, Impaired perceived functional ability, Impaired vision/preception, Postural dysfunction, Improper body mechanics  Visit Diagnosis: Abnormality of gait and mobility  Difficulty in walking, not elsewhere classified  Muscle weakness (generalized)  Unsteadiness on feet     Problem List There are no problems to display for this patient.   Lewis Moccasin, PT 10/09/2020, 8:53 AM  [place] MAIN [facility] SERVICES [address] [place], [place], [address] Phone: [phone]   Fax:  [phone]  Name: Ladanian Kelter MRN: [id number] Date of Birth: 25-Oct-1940

## 2020-10-14 ENCOUNTER — Ambulatory Visit: Payer: Medicare Other

## 2020-10-16 ENCOUNTER — Other Ambulatory Visit: Payer: Self-pay

## 2020-10-16 ENCOUNTER — Ambulatory Visit: Payer: Medicare Other

## 2020-10-16 DIAGNOSIS — R269 Unspecified abnormalities of gait and mobility: Secondary | ICD-10-CM | POA: Diagnosis not present

## 2020-10-16 DIAGNOSIS — M6281 Muscle weakness (generalized): Secondary | ICD-10-CM

## 2020-10-16 DIAGNOSIS — R262 Difficulty in walking, not elsewhere classified: Secondary | ICD-10-CM

## 2020-10-16 NOTE — Therapy (Signed)
[place] MAIN [facility] SERVICES [address] [place], [place], [address] Phone: [phone]   Fax:  [phone]  Physical Therapy Treatment  Patient Details  Name: Charles Townsend MRN: [id number] Date of Birth: 1940-11-12 Referring Provider (PT): Wonda Cerise   Encounter Date: 10/16/2020   PT End of Session - 10/16/20 0804     Visit Number 25    Number of Visits 31    Date for PT Re-Evaluation 12/11/20    Authorization Time Period Recert: 5/46/50- 35/04/6566    PT Start Time 0759    PT Stop Time 0844    PT Time Calculation (min) 45 min    Equipment Utilized During Treatment Gait belt    Activity Tolerance Patient tolerated treatment well    Behavior During Therapy [facility] for tasks assessed/performed             History reviewed. No pertinent past medical history.  History reviewed. No pertinent surgical history.  There were no vitals filed for this visit.   Subjective Assessment - 10/16/20 0803     Subjective Patient went down to [facility] over the weekend. No falls, missed monday due to a mixup thinking it was a holiday.    Pertinent History Patient is a pleasant 80 year old who presents for balance and walking. Reports it has began about a year ago and has gotten progressively worse.  When he tries to walk he falls .Patient uses a cane mostly, stumbles frequently and falls 3x/month, normally forward.  PMH includes blindness in R eye, type II diabetes, HTN, HLD, stage 3a CKD, CHB, chronic renal failure, b12 deficiency, macular degeneration, hypercholesterolemia, PCO, cardiac pacemaker, PVCs, neuropathy. CT was clear with no evidence of acute intracranial abnormalities.    Currently in Pain? No/denies                 INTERVENTIONS:      Neuromuscular re-education:  Standing with CGA next to support surface:  Airex pad: static stand 30 seconds x 2 trials, noticeable trembling of ankles/LE's with fatigue and challenge  to maintain stability Airex pad: eyes closed 30 seconds x2 trials  Airex pad: horizontal head turns 30 seconds scanning room 10x ; cueing for arc of motion  Airex pad: vertical head turns 30 seconds, cueing for arc of motion, noticeable sway with upward gaze increasing demand on ankle righting reaction musculature Airex weighted ball woodchop (2000 gr ball) 10x each side; occasional posterior LOB.  Airex pad: one foot on 6" step one foot on airex pad, hold position for 30 seconds, switch legs, 2x each LE;   Balloon taps reaching inside/outside BOS for pertubation, stabilization, and coordinated muscle reactions/reflexes. X 3 minutes   Anterior/posterior tilt on tilt board 10x each foot placement, BUE support   TherEx:  Nustep seat 11, resistance 4-5, cues for RPM> 60 for cardiovascular challenge.  GTB around bilateral ankles; lateral stepping 6x length of // bars GTB around bilateral ankles: monster walks forwards/backwards 2x length of // bars 10x STS; arms forward.   Education provided throughout session via VC/TC and demonstration to facilitate movement at target joints and correct muscle activation for all testing and exercises performed.   Patient presents to physical therapy with excellent motivation. Unstable surfaces are challenging for ankle righting reactions, specifically with modified tandem and eyes closed. He has occasional retro lean with unstable surfaces as well as dual task interventions. Patient will benefit from skilled physical therapy to improve balance, gait mechanics, and  overall mobility for decreased fall risk and improved quality of life                     PT Education - 10/16/20 0804     Education Details exercise technique, body mechanics    Person(s) Educated Patient    Methods Explanation;Demonstration;Tactile cues;Verbal cues    Comprehension Verbalized understanding;Returned demonstration;Verbal cues required;Tactile cues required               PT Short Term Goals - 07/31/20 0831       PT SHORT TERM GOAL #1   Title Patient will be independent in home exercise program to improve strength/mobility for better functional independence with ADLs    Baseline 6/22: HEP given 7/27: HEP compliant    Time 4    Period Weeks    Status Achieved    Target Date 07/24/20               PT Long Term Goals - 09/18/20 0829       PT LONG TERM GOAL #1   Title Patient will increase FOTO score to equal to or greater than  62%   to demonstrate statistically significant improvement in mobility and quality of life.    Baseline 6/22: 50% 7/27: 57% 9/14= 54%    Time 12    Period Weeks    Status On-going    Target Date 12/11/20      PT LONG TERM GOAL #2   Title Patient will increase Berg Balance score by > 6 points (42/56)  to demonstrate decreased fall risk during functional activities.    Baseline 6/22: 36/56 7/27: 45/56    Time 12    Period Weeks    Status Achieved      PT LONG TERM GOAL #3   Title Patient will increase six minute walk test distance to >1500 without LOB for progression to age norm community ambulator and improve gait ability    Baseline 6/22: 1100 ft with frequent LOB and use of SPC 7/27: 935 ft with SPC and multiple near LOB. 09/18/2020= 910 feet with use of SPC and no LOB today.    Time 12    Period Weeks    Status On-going      PT LONG TERM GOAL #4   Title Patient will increase dynamic gait index score to >19/24 as to demonstrate reduced fall risk and improved dynamic gait balance for better safety with community/home ambulation.    Baseline 6/22: 10/24 with cane 7/27: 13/24; 09/18/2020= 16/24    Time 12    Period Weeks    Status On-going      PT LONG TERM GOAL #5   Title Patient will ambulate 5 blocks or greater than 1500 feet using single-point cane, modified independent on all surfaces without rest break for loss of balance for improved community outings.    Baseline 09/18/2020-patient currently  limited to around 900 feet of community distance with intermittent unsteadiness using cane.    Time 12    Period Weeks    Target Date 12/11/20                   Plan - 10/16/20 0825     Clinical Impression Statement Patient presents to physical therapy with excellent motivation. Unstable surfaces are challenging for ankle righting reactions, specifically with modified tandem and eyes closed. He has occasional retro lean with unstable surfaces as well as dual task interventions. Patient will benefit from  skilled physical therapy to improve balance, gait mechanics, and overall mobility for decreased fall risk and improved quality of life    Personal Factors and Comorbidities Age;Comorbidity 3+;Social Background;Time since onset of injury/illness/exacerbation;Fitness;Transportation    Comorbidities blindness in R eye, type II diabetes, HTN, HLD, stage 3a CKD, CHB, chronic renal failure, b12 deficiency, macular degeneration, hypercholesterolemia, PCO, cardiac pacemaker, PVCs, neuropathy    Examination-Activity Limitations Bed Mobility;Bend;Carry;Dressing;Reach Overhead;Locomotion Level;Lift;Squat;Stairs;Stand;Toileting;Transfers    Examination-Participation Restrictions Cleaning;Community Activity;Church;Driving;Meal Prep;Laundry;Shop;Volunteer;Yard Work    [employer] officer Evolving/Moderate complexity    Rehab Potential Good    PT Frequency 2x / week    PT Duration 12 weeks    PT Treatment/Interventions ADLs/Self Care Home Management;Biofeedback;Canalith Repostioning;Cryotherapy;Moist Heat;Traction;Ultrasound;Functional mobility training;Stair training;Gait training;DME Instruction;Therapeutic activities;Therapeutic exercise;Balance training;Neuromuscular re-education;Cognitive remediation;Manual techniques;Orthotic Fit/Training;Patient/family education;Passive range of motion;Dry needling;Vestibular;Taping;Energy conservation;Visual/perceptual remediation/compensation     PT Next Visit Plan Continue with progressive Balance and LE Strengthening training next visit.    PT Home Exercise Plan Access Code: [email or web address]  URL: [email or web address]; No updates this visit    Consulted and Agree with Plan of Care Patient             Patient will benefit from skilled therapeutic intervention in order to improve the following deficits and impairments:  Abnormal gait, Decreased activity tolerance, Decreased balance, Decreased endurance, Decreased coordination, Decreased knowledge of use of DME, Decreased mobility, Difficulty walking, Decreased strength, Increased edema, Impaired perceived functional ability, Impaired vision/preception, Postural dysfunction, Improper body mechanics  Visit Diagnosis: Abnormality of gait and mobility  Difficulty in walking, not elsewhere classified  Muscle weakness (generalized)     Problem List There are no problems to display for this patient.   Janna Arch, PT, DPT  10/16/2020, 8:44 AM  [place] MAIN [facility] SERVICES [address] [place], [place], [address] Phone: [phone]   Fax:  [phone]  Name: Charles Townsend MRN: [id number] Date of Birth: 1940-06-08

## 2020-10-21 ENCOUNTER — Ambulatory Visit: Payer: Medicare Other

## 2020-10-23 ENCOUNTER — Other Ambulatory Visit: Payer: Self-pay | Admitting: Internal Medicine

## 2020-10-23 ENCOUNTER — Ambulatory Visit: Payer: Medicare Other

## 2020-10-23 DIAGNOSIS — I208 Other forms of angina pectoris: Secondary | ICD-10-CM

## 2020-10-28 ENCOUNTER — Ambulatory Visit: Payer: Medicare Other

## 2020-10-29 ENCOUNTER — Other Ambulatory Visit: Payer: Medicare Other

## 2020-10-30 ENCOUNTER — Ambulatory Visit: Payer: Medicare Other

## 2020-11-04 ENCOUNTER — Ambulatory Visit: Payer: Medicare Other

## 2020-11-06 ENCOUNTER — Ambulatory Visit: Payer: Medicare Other | Attending: Physician Assistant

## 2020-11-07 ENCOUNTER — Other Ambulatory Visit: Payer: Self-pay

## 2020-11-07 ENCOUNTER — Ambulatory Visit: Payer: Medicare Other | Attending: Internal Medicine

## 2020-11-07 DIAGNOSIS — Z23 Encounter for immunization: Secondary | ICD-10-CM

## 2020-11-07 MED ORDER — PFIZER COVID-19 VAC BIVALENT 30 MCG/0.3ML IM SUSP
INTRAMUSCULAR | 0 refills | Status: DC
  Filled 2020-11-07: qty 0.3, 1d supply, fill #0

## 2020-11-07 NOTE — Progress Notes (Signed)
   Covid-19 Vaccination Clinic  Name:  Charles Townsend    MRN: [id number] DOB: 23-May-1940  11/07/2020  [name] was observed post Covid-19 immunization for 15 minutes without incident. He was provided with Vaccine Information Sheet and instruction to access the V-Safe system.   [name] was instructed to call 911 with any severe reactions post vaccine: Difficulty breathing  Swelling of face and throat  A fast heartbeat  A bad rash all over body  Dizziness and weakness   Immunizations Administered     Name Date Dose VIS Date Route   Pfizer Covid-19 Vaccine Bivalent Booster 11/07/2020  1:09 PM 0.3 mL 09/04/2020 Intramuscular   Manufacturer: Sun   Lot: [id number]   [place]: [phone]

## 2020-11-11 ENCOUNTER — Ambulatory Visit: Payer: Medicare Other

## 2020-11-13 ENCOUNTER — Ambulatory Visit: Payer: Medicare Other

## 2020-11-18 ENCOUNTER — Ambulatory Visit: Payer: Medicare Other

## 2020-11-20 ENCOUNTER — Ambulatory Visit: Payer: Medicare Other

## 2020-11-25 ENCOUNTER — Ambulatory Visit: Payer: Medicare Other

## 2020-11-27 ENCOUNTER — Ambulatory Visit: Payer: Medicare Other

## 2020-12-02 ENCOUNTER — Ambulatory Visit: Payer: Medicare Other

## 2020-12-04 ENCOUNTER — Ambulatory Visit: Payer: Medicare Other

## 2020-12-09 ENCOUNTER — Ambulatory Visit: Payer: Medicare Other

## 2020-12-11 ENCOUNTER — Ambulatory Visit: Payer: Medicare Other

## 2020-12-16 ENCOUNTER — Ambulatory Visit: Payer: Medicare Other

## 2020-12-18 ENCOUNTER — Ambulatory Visit: Payer: Medicare Other

## 2020-12-23 ENCOUNTER — Ambulatory Visit: Payer: Medicare Other

## 2020-12-25 ENCOUNTER — Ambulatory Visit: Payer: Medicare Other

## 2021-01-01 ENCOUNTER — Ambulatory Visit: Payer: Medicare Other

## 2021-02-11 ENCOUNTER — Other Ambulatory Visit
Admission: RE | Admit: 2021-02-11 | Discharge: 2021-02-11 | Disposition: A | Discharge status: Home / Self Care | Payer: Medicare Other | Source: Ambulatory Visit | Attending: Infectious Diseases | Admitting: Infectious Diseases

## 2021-02-11 DIAGNOSIS — R0609 Other forms of dyspnea: Secondary | ICD-10-CM | POA: Diagnosis present

## 2021-02-11 DIAGNOSIS — R6889 Other general symptoms and signs: Secondary | ICD-10-CM | POA: Diagnosis present

## 2021-02-11 LAB — BRAIN NATRIURETIC PEPTIDE: B Natriuretic Peptide: 104.8 pg/mL — ABNORMAL HIGH (ref 0.0–100.0)

## 2021-02-21 ENCOUNTER — Other Ambulatory Visit: Payer: Self-pay | Admitting: Internal Medicine

## 2021-02-21 DIAGNOSIS — I2089 Other forms of angina pectoris: Secondary | ICD-10-CM

## 2021-02-21 DIAGNOSIS — R42 Dizziness and giddiness: Secondary | ICD-10-CM

## 2021-02-21 DIAGNOSIS — R0609 Other forms of dyspnea: Secondary | ICD-10-CM

## 2021-02-21 DIAGNOSIS — I208 Other forms of angina pectoris: Secondary | ICD-10-CM

## 2021-02-28 ENCOUNTER — Telehealth (HOSPITAL_COMMUNITY): Payer: Self-pay | Admitting: *Deleted

## 2021-02-28 NOTE — Telephone Encounter (Signed)
Attempted to call patient regarding upcoming cardiac CT appointment. °Left message on voicemail with name and callback number ° °Terrell Ostrand RN Navigator Cardiac Imaging °[facility] °[phone] Office °[phone] Cell ° °

## 2021-02-28 NOTE — Telephone Encounter (Signed)
Patient returning call regarding upcoming cardiac imaging study; pt verbalizes understanding of appt date/time, parking situation and where to check in, pre-test NPO status and medications ordered, and verified current allergies; name and call back number provided for further questions should they arise  Gordy Clement RN Navigator Cardiac [place] [facility] [phone] office [phone] cell  Patient to take 12.5mg  metoprolol succiante for test.

## 2021-03-03 ENCOUNTER — Other Ambulatory Visit: Payer: Self-pay

## 2021-03-03 ENCOUNTER — Ambulatory Visit
Admission: RE | Admit: 2021-03-03 | Discharge: 2021-03-03 | Disposition: A | Discharge status: Home / Self Care | Payer: Medicare Other | Source: Ambulatory Visit | Attending: Internal Medicine | Admitting: Internal Medicine

## 2021-03-03 DIAGNOSIS — R42 Dizziness and giddiness: Secondary | ICD-10-CM

## 2021-03-03 DIAGNOSIS — I208 Other forms of angina pectoris: Secondary | ICD-10-CM

## 2021-03-03 DIAGNOSIS — R0609 Other forms of dyspnea: Secondary | ICD-10-CM

## 2021-03-03 MED ORDER — IOHEXOL 350 MG/ML SOLN
75.0000 mL | Freq: Once | INTRAVENOUS | Status: DC | PRN
Start: 2021-03-03 — End: 2021-03-04

## 2021-03-03 NOTE — Progress Notes (Signed)
Patient has a pacemaker and didn't have adequate medication to decrease HR. Plan is to reschedule with 100 metoprolol and 15 Ivabradine.

## 2021-03-04 ENCOUNTER — Other Ambulatory Visit (HOSPITAL_COMMUNITY): Payer: Self-pay | Admitting: *Deleted

## 2021-03-04 ENCOUNTER — Telehealth (HOSPITAL_COMMUNITY): Payer: Self-pay | Admitting: *Deleted

## 2021-03-04 MED ORDER — METOPROLOL TARTRATE 100 MG PO TABS: ORAL_TABLET | ORAL | 0 refills | Status: DC

## 2021-03-04 MED ORDER — IVABRADINE HCL 7.5 MG PO TABS
ORAL_TABLET | ORAL | 0 refills | Status: DC
Start: 2021-03-04 — End: 2021-09-22

## 2021-03-04 NOTE — Telephone Encounter (Signed)
Reaching out to patient to confirm pharmacy.  Prescriptions sent for 100mg  metoprolol and 15mg  ivabradine for his rescheduled cardiac CT scan.  Patient verbalized understanding.   Gordy Clement RN Navigator Cardiac Imaging [facility] [phone] office [phone] cell

## 2021-03-12 ENCOUNTER — Telehealth (HOSPITAL_COMMUNITY): Payer: Self-pay | Admitting: *Deleted

## 2021-03-12 NOTE — Telephone Encounter (Signed)
Returning patient's daughter's call regarding upcoming cardiac imaging study; pt's daughter verbalizes understanding of appt date/time, parking situation and where to check in, pre-test NPO status and medications ordered, and verified current allergies; name and call back number provided for further questions should they arise ? ?Gordy Clement RN Navigator Cardiac Imaging ?[facility] ?[phone] office ?[phone] cell ? ?Patient to take '100mg'$  metoprolol tartrate and '15mg'$  ivabradine two hours prior to his cardiac CT scan. ?

## 2021-03-13 ENCOUNTER — Encounter: Payer: Self-pay | Admitting: Emergency Medicine

## 2021-03-13 ENCOUNTER — Inpatient Hospital Stay
Admission: EM | Admit: 2021-03-13 | Discharge: 2021-03-15 | DRG: 175 | Disposition: A | Discharge status: Home / Self Care | Payer: Medicare Other | Attending: Internal Medicine | Admitting: Internal Medicine

## 2021-03-13 ENCOUNTER — Other Ambulatory Visit: Payer: Self-pay

## 2021-03-13 ENCOUNTER — Emergency Department: Payer: Medicare Other

## 2021-03-13 ENCOUNTER — Ambulatory Visit
Admission: RE | Admit: 2021-03-13 | Discharge: 2021-03-13 | Disposition: A | Discharge status: Home / Self Care | Payer: Medicare Other | Source: Ambulatory Visit | Attending: Internal Medicine | Admitting: Internal Medicine

## 2021-03-13 DIAGNOSIS — I129 Hypertensive chronic kidney disease with stage 1 through stage 4 chronic kidney disease, or unspecified chronic kidney disease: Secondary | ICD-10-CM | POA: Diagnosis present

## 2021-03-13 DIAGNOSIS — Z66 Do not resuscitate: Secondary | ICD-10-CM | POA: Diagnosis present

## 2021-03-13 DIAGNOSIS — Z8249 Family history of ischemic heart disease and other diseases of the circulatory system: Secondary | ICD-10-CM

## 2021-03-13 DIAGNOSIS — Z79899 Other long term (current) drug therapy: Secondary | ICD-10-CM

## 2021-03-13 DIAGNOSIS — I2609 Other pulmonary embolism with acute cor pulmonale: Principal | ICD-10-CM | POA: Diagnosis present

## 2021-03-13 DIAGNOSIS — R0609 Other forms of dyspnea: Secondary | ICD-10-CM | POA: Insufficient documentation

## 2021-03-13 DIAGNOSIS — Z89429 Acquired absence of other toe(s), unspecified side: Secondary | ICD-10-CM

## 2021-03-13 DIAGNOSIS — Z7984 Long term (current) use of oral hypoglycemic drugs: Secondary | ICD-10-CM

## 2021-03-13 DIAGNOSIS — Z7982 Long term (current) use of aspirin: Secondary | ICD-10-CM

## 2021-03-13 DIAGNOSIS — I442 Atrioventricular block, complete: Secondary | ICD-10-CM | POA: Diagnosis present

## 2021-03-13 DIAGNOSIS — N1831 Chronic kidney disease, stage 3a: Secondary | ICD-10-CM | POA: Diagnosis present

## 2021-03-13 DIAGNOSIS — I208 Other forms of angina pectoris: Secondary | ICD-10-CM | POA: Insufficient documentation

## 2021-03-13 DIAGNOSIS — E1169 Type 2 diabetes mellitus with other specified complication: Secondary | ICD-10-CM | POA: Diagnosis not present

## 2021-03-13 DIAGNOSIS — I2699 Other pulmonary embolism without acute cor pulmonale: Secondary | ICD-10-CM | POA: Diagnosis present

## 2021-03-13 DIAGNOSIS — E785 Hyperlipidemia, unspecified: Secondary | ICD-10-CM | POA: Diagnosis present

## 2021-03-13 DIAGNOSIS — Z20822 Contact with and (suspected) exposure to covid-19: Secondary | ICD-10-CM | POA: Diagnosis present

## 2021-03-13 DIAGNOSIS — R42 Dizziness and giddiness: Secondary | ICD-10-CM | POA: Insufficient documentation

## 2021-03-13 DIAGNOSIS — K219 Gastro-esophageal reflux disease without esophagitis: Secondary | ICD-10-CM | POA: Diagnosis present

## 2021-03-13 DIAGNOSIS — Z87891 Personal history of nicotine dependence: Secondary | ICD-10-CM

## 2021-03-13 DIAGNOSIS — E1122 Type 2 diabetes mellitus with diabetic chronic kidney disease: Secondary | ICD-10-CM | POA: Diagnosis present

## 2021-03-13 DIAGNOSIS — Z950 Presence of cardiac pacemaker: Secondary | ICD-10-CM

## 2021-03-13 LAB — CBG MONITORING, ED: Glucose-Capillary: 124 mg/dL — ABNORMAL HIGH (ref 70–99)

## 2021-03-13 LAB — CBC WITH DIFFERENTIAL/PLATELET
Abs Immature Granulocytes: 0.03 10*3/uL (ref 0.00–0.07)
Basophils Absolute: 0.1 10*3/uL (ref 0.0–0.1)
Basophils Relative: 1 %
Eosinophils Absolute: 0.7 10*3/uL — ABNORMAL HIGH (ref 0.0–0.5)
Eosinophils Relative: 11 %
HCT: 38.5 % — ABNORMAL LOW (ref 39.0–52.0)
Hemoglobin: 12.4 g/dL — ABNORMAL LOW (ref 13.0–17.0)
Immature Granulocytes: 1 %
Lymphocytes Relative: 22 %
Lymphs Abs: 1.3 10*3/uL (ref 0.7–4.0)
MCH: 30 pg (ref 26.0–34.0)
MCHC: 32.2 g/dL (ref 30.0–36.0)
MCV: 93.2 fL (ref 80.0–100.0)
Monocytes Absolute: 0.3 10*3/uL (ref 0.1–1.0)
Monocytes Relative: 5 %
Neutro Abs: 3.6 10*3/uL (ref 1.7–7.7)
Neutrophils Relative %: 60 %
Platelets: 256 10*3/uL (ref 150–400)
RBC: 4.13 MIL/uL — ABNORMAL LOW (ref 4.22–5.81)
RDW: 13.2 % (ref 11.5–15.5)
WBC: 5.9 10*3/uL (ref 4.0–10.5)
nRBC: 0 % (ref 0.0–0.2)

## 2021-03-13 LAB — RESP PANEL BY RT-PCR (FLU A&B, COVID) ARPGX2
Influenza A by PCR: NEGATIVE
Influenza B by PCR: NEGATIVE
SARS Coronavirus 2 by RT PCR: NEGATIVE

## 2021-03-13 LAB — COMPREHENSIVE METABOLIC PANEL
ALT: 17 U/L (ref 0–44)
AST: 17 U/L (ref 15–41)
Albumin: 3.9 g/dL (ref 3.5–5.0)
Alkaline Phosphatase: 42 U/L (ref 38–126)
Anion gap: 6 (ref 5–15)
BUN: 16 mg/dL (ref 8–23)
CO2: 28 mmol/L (ref 22–32)
Calcium: 9.6 mg/dL (ref 8.9–10.3)
Chloride: 102 mmol/L (ref 98–111)
Creatinine, Ser: 1.28 mg/dL — ABNORMAL HIGH (ref 0.61–1.24)
GFR, Estimated: 56 mL/min — ABNORMAL LOW (ref >60)
Glucose, Bld: 90 mg/dL (ref 70–99)
Potassium: 4.8 mmol/L (ref 3.5–5.1)
Sodium: 136 mmol/L (ref 135–145)
Total Bilirubin: 0.8 mg/dL (ref 0.3–1.2)
Total Protein: 7.1 g/dL (ref 6.5–8.1)

## 2021-03-13 LAB — PROTIME-INR
INR: 1.1 (ref 0.8–1.2)
Prothrombin Time: 13.9 seconds (ref 11.4–15.2)

## 2021-03-13 LAB — TROPONIN I (HIGH SENSITIVITY)
Troponin I (High Sensitivity): 18 ng/L — ABNORMAL HIGH (ref <18)
Troponin I (High Sensitivity): 16 ng/L (ref <18)

## 2021-03-13 LAB — GLUCOSE, CAPILLARY: Glucose-Capillary: 75 mg/dL (ref 70–99)

## 2021-03-13 LAB — HEPARIN LEVEL (UNFRACTIONATED): Heparin Unfractionated: 0.95 IU/mL — ABNORMAL HIGH (ref 0.30–0.70)

## 2021-03-13 LAB — BRAIN NATRIURETIC PEPTIDE: B Natriuretic Peptide: 122.6 pg/mL — ABNORMAL HIGH (ref 0.0–100.0)

## 2021-03-13 LAB — APTT: aPTT: 33 seconds (ref 24–36)

## 2021-03-13 MED ORDER — MORPHINE SULFATE (PF) 2 MG/ML IV SOLN: 1.0000 mg | INTRAVENOUS | Status: DC | PRN

## 2021-03-13 MED ORDER — IOHEXOL 350 MG/ML SOLN
75.0000 mL | Freq: Once | INTRAVENOUS | Status: AC | PRN
  Administered 2021-03-13: 10:00:00 75 mL via INTRAVENOUS

## 2021-03-13 MED ORDER — PANTOPRAZOLE SODIUM 40 MG PO TBEC: 40.0000 mg | DELAYED_RELEASE_TABLET | Freq: Every day | ORAL | Status: DC

## 2021-03-13 MED ORDER — ATORVASTATIN CALCIUM 20 MG PO TABS
20.0000 mg | ORAL_TABLET | Freq: Every day | ORAL | Status: DC
  Administered 2021-03-13 – 2021-03-14 (×2): 20 mg via ORAL
  Filled 2021-03-13 (×2): qty 1

## 2021-03-13 MED ORDER — PNEUMOCOCCAL 20-VAL CONJ VACC 0.5 ML IM SUSY
0.5000 mL | PREFILLED_SYRINGE | INTRAMUSCULAR | Status: DC | PRN
  Filled 2021-03-13: qty 0.5

## 2021-03-13 MED ORDER — ONDANSETRON HCL 4 MG/2ML IJ SOLN: 4.0000 mg | Freq: Four times a day (QID) | INTRAMUSCULAR | Status: DC | PRN

## 2021-03-13 MED ORDER — HEPARIN BOLUS VIA INFUSION
5000.0000 [IU] | Freq: Once | INTRAVENOUS | Status: AC
  Administered 2021-03-13: 12:00:00 5000 [IU] via INTRAVENOUS
  Filled 2021-03-13: qty 5000

## 2021-03-13 MED ORDER — METOPROLOL TARTRATE 5 MG/5ML IV SOLN: 10.0000 mg | Freq: Once | INTRAVENOUS | Status: DC

## 2021-03-13 MED ORDER — NITROGLYCERIN 0.4 MG SL SUBL
0.8000 mg | SUBLINGUAL_TABLET | Freq: Once | SUBLINGUAL | Status: AC
  Administered 2021-03-13: 10:00:00 0.8 mg via SUBLINGUAL

## 2021-03-13 MED ORDER — INSULIN ASPART 100 UNIT/ML IJ SOLN
0.0000 [IU] | Freq: Three times a day (TID) | INTRAMUSCULAR | Status: DC
  Administered 2021-03-13 – 2021-03-15 (×4): 1 [IU] via SUBCUTANEOUS
  Filled 2021-03-13 (×3): qty 1

## 2021-03-13 MED ORDER — SODIUM CHLORIDE 0.9% FLUSH
3.0000 mL | Freq: Two times a day (BID) | INTRAVENOUS | Status: DC
  Administered 2021-03-13 (×2): 3 mL via INTRAVENOUS

## 2021-03-13 MED ORDER — ACETAMINOPHEN 650 MG RE SUPP: 650.0000 mg | Freq: Four times a day (QID) | RECTAL | Status: DC | PRN

## 2021-03-13 MED ORDER — HEPARIN (PORCINE) 25000 UT/250ML-% IV SOLN
1100.0000 [IU]/h | INTRAVENOUS | Status: DC
  Administered 2021-03-13: 12:00:00 1500 [IU]/h via INTRAVENOUS
  Administered 2021-03-14: 1300 [IU]/h via INTRAVENOUS
  Filled 2021-03-13 (×2): qty 250

## 2021-03-13 MED ORDER — BISACODYL 5 MG PO TBEC: 5.0000 mg | DELAYED_RELEASE_TABLET | Freq: Every day | ORAL | Status: DC | PRN

## 2021-03-13 MED ORDER — ONDANSETRON HCL 4 MG PO TABS: 4.0000 mg | ORAL_TABLET | Freq: Four times a day (QID) | ORAL | Status: DC | PRN

## 2021-03-13 MED ORDER — ACETAMINOPHEN 325 MG PO TABS: 650.0000 mg | ORAL_TABLET | Freq: Four times a day (QID) | ORAL | Status: DC | PRN

## 2021-03-13 NOTE — Consult Note (Signed)
Charles Townsend  MRN : [id number]  Charles Townsend is a 81 y.o. (Jun 02, 1940) male who presents with chief complaint of  Chief Complaint  Patient presents with   Shortness of Breath  .  History of Present Illness: We are asked to consult by Dr. Tamala Julian for a pulmonary embolism.  Mr. Charles Townsend is an 81 year old male that presented to [facility] on 03/13/2021 due to bilateral pulmonary embolism found on outpatient CT coronary study, which was done to assess for some worsening shortness of breath.  Patient notes that approximately a year ago he was able to walk for a mile daily and then recently was only able to walk for a block.  He notes that initially started with weakness and discomfort in his lower extremities.  He also notes that at that time he began to feel fatigued when walking for these long distances.  He has progressively gotten short of breath when over the last few weeks prompting work-up.  The patient had an echocardiogram done by his cardiologist approximately a month ago and it was essentially normal.  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  Current Facility-Administered Medications  Medication Dose Route Frequency Provider Last Rate Last Admin   acetaminophen (TYLENOL) tablet 650 mg  650 mg Oral Q6H PRN Wyvonnia Dusky, MD       Or   acetaminophen (TYLENOL) suppository 650 mg  650 mg Rectal Q6H PRN Wyvonnia Dusky, MD       atorvastatin (LIPITOR) tablet 20 mg  20 mg Oral q1800 Wyvonnia Dusky, MD       bisacodyl (DULCOLAX) EC tablet 5 mg  5 mg Oral Daily PRN Wyvonnia Dusky, MD       heparin ADULT infusion 100 units/mL (25000 units/242m)  1,500 Units/hr Intravenous Continuous HDarnelle Bos RPH 15 mL/hr at 03/13/21 1218 1,500 Units/hr at 03/13/21 1218   insulin aspart (novoLOG) injection 0-9 Units  0-9 Units  Subcutaneous TID WC WWyvonnia Dusky MD       morphine (PF) 2 MG/ML injection 1 mg  1 mg Intravenous Q4H PRN WWyvonnia Dusky MD       ondansetron ([facility] tablet 4 mg  4 mg Oral Q6H PRN WWyvonnia Dusky MD       Or   ondansetron ([facility] injection 4 mg  4 mg Intravenous Q6H PRN WWyvonnia Dusky MD       sodium chloride flush (NS) 0.9 % injection 3 mL  3 mL Intravenous Q12H WWyvonnia Dusky MD   3 mL at 03/13/21 1219   Current Outpatient Medications  Medication Sig Dispense Refill   aspirin EC 81 MG tablet Take 81 mg by mouth daily. Swallow whole.     atorvastatin (LIPITOR) 20 MG tablet Take 20 mg by mouth daily.     calcium-vitamin D (OSCAL WITH D) 500-200 MG-UNIT tablet Take 1 tablet by mouth.     Insulin Glargine (BASAGLAR KWIKPEN) 100 UNIT/ML Inject 32 Units into the skin daily.     ivabradine (CORLANOR) 7.5 MG TABS tablet Take '15mg'$  tablets TWO hours prior to cardiac CT scan. (Patient not taking: Reported on 03/13/2021) 2 tablet 0   losartan (COZAAR) 25 MG tablet Take 25 mg by mouth daily.     metFORMIN (GLUCOPHAGE-XR) 500 MG 24 hr tablet Take 2,000 mg by mouth at bedtime.     metoprolol tartrate (  LOPRESSOR) 100 MG tablet Take '100mg'$  tablet TWO hours prior to your cardiac CT scan. (Patient not taking: Reported on 03/13/2021) 1 tablet 0   vitamin B-12 (CYANOCOBALAMIN) 500 MCG tablet Take 500 mcg by mouth daily.     COVID-19 mRNA bivalent vaccine, Pfizer, (PFIZER COVID-19 VAC BIVALENT) injection Inject into the muscle. (Patient not taking: Reported on 03/13/2021) 0.3 mL 0   COVID-19 mRNA Vac-TriS, Pfizer, (PFIZER-BIONT COVID-19 VAC-TRIS) SUSP injection Inject into the muscle. (Patient not taking: Reported on 03/13/2021) 0.3 mL 0   furosemide (LASIX) 20 MG tablet Take 20 mg by mouth daily. (Patient not taking: Reported on 03/13/2021)     metoprolol succinate (TOPROL-XL) 25 MG 24 hr tablet Take 12.5 mg by mouth daily. (Patient not taking: Reported on 03/13/2021)     pantoprazole  (PROTONIX) 40 MG tablet Take 40 mg by mouth daily.     pseudoephedrine-acetaminophen (TYLENOL SINUS) 30-500 MG TABS tablet Take 1 tablet by mouth every 4 (four) hours as needed. (Patient not taking: Reported on 03/13/2021)     Facility-Administered Medications Ordered in Other Encounters  Medication Dose Route Frequency Provider Last Rate Last Admin   metoprolol tartrate (LOPRESSOR) injection 10 mg  10 mg Intravenous Once Kate Sable, MD        History reviewed. No pertinent past medical history.  History reviewed. No pertinent surgical history.  Social History Social History   Tobacco Use   Smoking status: Never   Smokeless tobacco: Never    Family History History reviewed. No pertinent family history.  Allergies  Allergen Reactions   Lisinopril Cough    cough cough cough    Metoprolol Cough     REVIEW OF SYSTEMS (Negative unless checked)  Constitutional: '[]'$ Weight loss  '[]'$ Fever  '[]'$ Chills Cardiac: '[]'$ Chest pain   '[]'$ Chest pressure   '[]'$ Palpitations   '[]'$ Shortness of breath when laying flat   '[]'$ Shortness of breath at rest   '[x]'$ Shortness of breath with exertion. Vascular:  '[]'$ Pain in legs with walking   '[]'$ Pain in legs at rest   '[]'$ Pain in legs when laying flat   '[]'$ Claudication   '[]'$ Pain in feet when walking  '[]'$ Pain in feet at rest  '[]'$ Pain in feet when laying flat   '[]'$ History of DVT   '[]'$ Phlebitis   '[]'$ Swelling in legs   '[]'$ Varicose veins   '[]'$ Non-healing ulcers Pulmonary:   '[]'$ Uses home oxygen   '[]'$ Productive cough   '[]'$ Hemoptysis   '[]'$ Wheeze  '[]'$ COPD   '[]'$ Asthma Neurologic:  '[]'$ Dizziness  '[]'$ Blackouts   '[]'$ Seizures   '[]'$ History of stroke   '[]'$ History of TIA  '[]'$ Aphasia   '[]'$ Temporary blindness   '[]'$ Dysphagia   '[]'$ Weakness or numbness in arms   '[x]'$ Weakness or numbness in legs Musculoskeletal:  '[]'$ Arthritis   '[]'$ Joint swelling   '[]'$ Joint pain   '[]'$ Low back pain Hematologic:  '[]'$ Easy bruising  '[]'$ Easy bleeding   '[]'$ Hypercoagulable state   '[]'$ Anemic  '[]'$ Hepatitis Gastrointestinal:  '[]'$ Blood in stool    '[]'$ Vomiting blood  '[]'$ Gastroesophageal reflux/heartburn   '[]'$ Difficulty swallowing. Genitourinary:  '[]'$ Chronic kidney disease   '[]'$ Difficult urination  '[]'$ Frequent urination  '[]'$ Burning with urination   '[]'$ Blood in urine Skin:  '[]'$ Rashes   '[]'$ Ulcers   '[]'$ Wounds Psychological:  '[]'$ History of anxiety   '[]'$  History of major depression.  Physical Examination  Vitals:   03/13/21 1045 03/13/21 1047 03/13/21 1215 03/13/21 1217  BP:  (!) 136/97 133/71 132/69  Pulse:  65 (!) 107 (!) 108  Resp:  20    Temp:  98.5 F (36.9 C)    TempSrc:  Oral    SpO2:  96% 96% 97%  Weight: 93 kg     Height: '5\' 10"'$  (1.778 m)      Body mass index is 29.41 kg/m. Gen:  WD/WN, NAD Head: [place]/AT, No temporalis wasting. Prominent temp pulse not noted. Ear/Nose/Throat: Hearing grossly intact, nares w/o erythema or drainage, oropharynx w/o Erythema/Exudate Neck: Trachea midline.  No JVD.  Pulmonary:  Good air movement, respirations not labored, equal bilaterally.  Cardiac: RRR, normal S1, S2. Vascular: Bilateral feet cool to touch PT Not Palpable Not Palpable  DP Not Palpable Trace palpable   Gastrointestinal: soft, non-tender/non-distended. No guarding/reflex.  Musculoskeletal: M/S 5/5 throughout.  Extremities without ischemic changes.  No deformity or atrophy. No edema. Neurologic: Sensation grossly intact in extremities.  Symmetrical.  Speech is fluent. Motor exam as listed above. Psychiatric: Judgment intact, Mood & affect appropriate for pt's clinical situation. Dermatologic: No rashes or ulcers noted.  No cellulitis or open wounds. Lymph : No Cervical, Axillary, or Inguinal lymphadenopathy.     CBC Lab Results  Component Value Date   WBC 5.9 03/13/2021   HGB 12.4 (L) 03/13/2021   HCT 38.5 (L) 03/13/2021   MCV 93.2 03/13/2021   PLT 256 03/13/2021    BMET    Component Value Date/Time   NA 136 03/13/2021 1044   K 4.8 03/13/2021 1044   CL 102 03/13/2021 1044   CO2 28 03/13/2021 1044   GLUCOSE 90 03/13/2021  1044   BUN 16 03/13/2021 1044   CREATININE 1.28 (H) 03/13/2021 1044   CALCIUM 9.6 03/13/2021 1044   GFRNONAA 56 (L) 03/13/2021 1044   Estimated Creatinine Clearance: 51.9 mL/min (A) (by C-G formula based on SCr of 1.28 mg/dL (H)).  COAG Lab Results  Component Value Date   INR 1.1 03/13/2021    Radiology US Venous Img Lower Bilateral  Result Date: 03/13/2021 CLINICAL DATA:  Pulmonary embolism. EXAM: BILATERAL LOWER EXTREMITY VENOUS DOPPLER ULTRASOUND TECHNIQUE: Gray-scale sonography with graded compression, as well as color Doppler and duplex ultrasound were performed to evaluate the lower extremity deep venous systems from the level of the common femoral vein and including the common femoral, femoral, profunda femoral, popliteal and calf veins including the posterior tibial, peroneal and gastrocnemius veins when visible. The superficial great saphenous vein was also interrogated. Spectral Doppler was utilized to evaluate flow at rest and with distal augmentation maneuvers in the common femoral, femoral and popliteal veins. COMPARISON:  None. FINDINGS: RIGHT LOWER EXTREMITY Common Femoral Vein: No evidence of thrombus. Normal compressibility, respiratory phasicity and response to augmentation. Saphenofemoral Junction: No evidence of thrombus. Normal compressibility and flow on color Doppler imaging. Profunda Femoral Vein: No evidence of thrombus. Normal compressibility and flow on color Doppler imaging. Femoral Vein: No evidence of thrombus. Normal compressibility, respiratory phasicity and response to augmentation. Popliteal Vein: No evidence of thrombus. Normal compressibility, respiratory phasicity and response to augmentation. Calf Veins: No evidence of thrombus. Normal compressibility and flow on color Doppler imaging. Superficial Great Saphenous Vein: No evidence of thrombus. Normal compressibility. Venous Reflux:  None. Other Findings:  None. LEFT LOWER EXTREMITY Common Femoral Vein: No  evidence of thrombus. Normal compressibility, respiratory phasicity and response to augmentation. Saphenofemoral Junction: No evidence of thrombus. Normal compressibility and flow on color Doppler imaging. Profunda Femoral Vein: No evidence of thrombus. Normal compressibility and flow on color Doppler imaging. Femoral Vein: No evidence of thrombus. Normal compressibility, respiratory phasicity and response to augmentation. Popliteal Vein: No evidence of thrombus. Normal compressibility, respiratory phasicity and  response to augmentation. Calf Veins: No evidence of thrombus. Normal compressibility and flow on color Doppler imaging. Superficial Great Saphenous Vein: No evidence of thrombus. Normal compressibility. Venous Reflux:  None. Other Findings:  None. IMPRESSION: No evidence of deep venous thrombosis in either lower extremity. Electronically Signed   By: Marijo Sanes M.D.   On: 03/13/2021 12:06   CT CORONARY MORPH W/CTA COR W/SCORE W/CA W/CM &/OR WO/CM  Addendum Date: 03/13/2021   ADDENDUM REPORT: 03/13/2021 14:15 CLINICAL DATA:  Dyspnea on exertion EXAM: Cardiac/Coronary  CTA TECHNIQUE: The patient was scanned on a Siemens Somatom go.Top scanner. : A retrospective scan was triggered in the descending thoracic aorta. Axial non-contrast 3 mm slices were carried out through the heart. The data set was analyzed on a dedicated work station and scored using the Wallace. Gantry rotation speed was 330 msecs and collimation was .6 mm. '100mg'$  of metoprolol, ivabradine '15mg'$  x 1, and 0.8 mg of sl NTG was given. The 3D data set was reconstructed in 5% intervals of the 60-95 % of the R-R cycle. Diastolic phases were analyzed on a dedicated work station using MPR, MIP and VRT modes. The patient received 75 cc of contrast. FINDINGS: Aorta: Normal size. Mild descending aorta calcifications. No dissection. Aortic Valve:  Trileaflet.  No calcifications. Coronary Arteries:  Normal coronary origin.  Right dominance. RCA  is a dominant artery that gives rise to PDA and PLA. There is no plaque. Left main is a large artery that gives rise to LAD and LCX arteries. LM has no disease. LAD has calcified plaque in the proximal segment causing mild stenosis (25-49%). LCX is a non-dominant artery that gives rise to two obtuse marginal branches. There is no plaque. Other findings: Normal pulmonary vein drainage into the left atrium. Normal left atrial appendage without a thrombus. Normal size of the pulmonary artery. Bilateral thrombus noted in the PA IMPRESSION: 1. Coronary calcium score of 477. This was 52nd percentile for age and sex matched control. 2. Normal coronary origin with right dominance. 3. Calcified plaque causing mild proximal LAD stenosis (25-49%). 4. CAD-RADS 2. Mild non-obstructive CAD (25-49%). Consider non-atherosclerotic causes of chest pain. Consider preventive therapy and risk factor modification. 5. Bilateral pulmonary embolus noted. Electronically Signed   By: Kate Sable M.D.   On: 03/13/2021 14:15   Result Date: 03/13/2021 EXAM: OVER-READ INTERPRETATION  CT CHEST The following report is an over-read performed by radiologist Dr. Abigail Miyamoto of [facility], [place] on 03/13/2021. This over-read does not include interpretation of cardiac or coronary anatomy or pathology. The coronary CTA interpretation by the cardiologist is attached. COMPARISON:  Chest radiograph, report only, from 02/11/2021 FINDINGS: Vascular: Aortic atherosclerosis. Bilateral pulmonary emboli, including within the branch main pulmonary artery on 04/19, throughout right greater than left lobar branches. Pulmonary artery enlargement, including at 3.3 cm. RV-LV ratio of 3.8/3.4. Mediastinum/Nodes: Borderline subcarinal adenopathy at 11 mm is likely reactive in the setting of interstitial lung disease. Lungs/Pleura: No pleural fluid. Interstitial lung disease, as evidenced by subpleural reticulation, architectural distortion, and minimal  traction bronchiolectasis. Mild craniocaudal gradient. Moderate centrilobular emphysema. Upper Abdomen: Mild hepatic steatosis. Normal imaged portions of the stomach. Musculoskeletal: No acute osseous abnormality. Midthoracic spondylosis. IMPRESSION: 1. Large volume, bilateral pulmonary emboli. Positive for acute PE with CT evidence of right heart strain (RV/LV Ratio = 1.1) consistent with at least submassive (intermediate risk) PE. The presence of right heart strain has been associated with an increased risk of morbidity and mortality. Please refer to  the "PE Focused" order set in EPIC. 2. Interstitial lung disease, with differential considerations of nonspecific interstitial pneumonitis or early usual interstitial pneumonitis. Consider nonemergent pulmonary consultation and high-resolution chest CT at 6 months. 3.  Aortic Atherosclerosis (ICD10-I70.0).  Emphysema (ICD10-J43.9). 4. Hepatic steatosis The technologist was in the process of calling the referring doctor prior to this dictation. In addition, the patient was being sent to the emergency room via ambulance. He was clinically stable, in no acute distress, as of approximately 10:05 a.m. Electronically Signed: By: Abigail Miyamoto M.D. On: 03/13/2021 10:21      Assessment/Plan Pulmonary Embolism CT scan done today does show large burden pulmonary embolism with right heart strain.  The patient had an echocardiogram done by his cardiologist approximately a month ago that did not show right heart strain.  Based on this we suspect that the pulmonary embolism is less than 11 weeks old.  However the chronicity of the clot is still an unknown variable.  I discussed the pulmonary thrombectomy with the patient and his family.  Because we are unsure of how long he has had this pulmonary embolism we cannot be sure that we will be able to remove large amounts of clot.  If the embolism has begun to organize and become more chronic in nature there is an increased  likelihood that the pulmonary thrombectomy will not be effective.  This was explained with the family in length.  They wish to consider and discuss the procedure among themselves.  We will reevaluate tomorrow to see if the patient wishes to move forward.  PAD Based upon the patient's history of being a previous smoker with hypertension, diabetes, and hyperlipidemia as well as claudication-like symptoms, I suspect he may have a component of peripheral arterial disease.  Today the patient's pulses were not easily palpable and his feet were cool to palpation.  I discussed with family that we can do ABIs in our office on an outpatient basis.    Kris Hartmann, NP  03/13/2021 2:19 PM    This Townsend was created with Dragon medical transcription system.  Any error is purely unintentional

## 2021-03-13 NOTE — ED Triage Notes (Signed)
Pt via EMS from [facility]. Pt c/o SOB. 98% on RA per EMS. Per EMS, upon exertion his HR went from 50s to 20s. Denies any pain or CP. Per staff they may have seen bilateral PE on CT. Pt received 2 Nitro tablets. Pt is A&OX4 and NAD ? ?Denies pain. Denies SOB at this time.  ?

## 2021-03-13 NOTE — Consult Note (Signed)
ANTICOAGULATION CONSULT NOTE  ? ?Pharmacy Consult for heparin ?Indication: pulmonary embolus ? ?Allergies  ?Allergen Reactions  ? Lisinopril Cough  ?  cough ?cough ?cough ?  ? Metoprolol Cough  ? ? ?Patient Measurements: ?Height: '5\' 10"'$  (177.8 cm) ?Weight: 93 kg (205 lb) ?IBW/kg (Calculated) : 73 ?Heparin Dosing Weight: 91.8 kg ? ?Vital Signs: ?Temp: 98 ?F (36.7 ?C) (03/09 2009) ?Temp Source: Oral (03/09 1047) ?BP: 125/72 (03/09 2009) ?Pulse Rate: 61 (03/09 2009) ? ?Labs: ?Recent Labs  ?  03/13/21 ?1044 03/13/21 ?2001  ?HGB 12.4*  --   ?HCT 38.5*  --   ?PLT 256  --   ?APTT 33  --   ?LABPROT 13.9  --   ?INR 1.1  --   ?HEPARINUNFRC  --  0.95*  ?CREATININE 1.28*  --   ?TROPONINIHS 18* 16  ? ? ? ?Estimated Creatinine Clearance: 51.9 mL/min (A) (by C-G formula based on SCr of 1.28 mg/dL (H)). ? ? ?Medical History: ?History reviewed. No pertinent past medical history. ? ?Medications:  ?No PTA anticoagulation, only aspirin 81 mg daily  ? ?Assessment: ?81 y.o. male  with PMH significant for CHB s/p Pacemaker placement (St. Jude, 2014), PVC's, HTN, HLD, DM type II and CKD stage III who presents for further evaluation with concerns for a PE seen on an outpatient CT. Pharmacy has been consulted for heparin dosing.  ? ?Baseline labs: Hgb 12.4, plts 256, aPTT 33, INR 1.1  ? ?3/9 2001 HL 0.95  ? ?Goal of Therapy:  ?Heparin level 0.3-0.7 units/ml ?Monitor platelets by anticoagulation protocol: Yes ?  ?Plan:  ?Heparin level is subtherapeutic. Will decrease heparin infusion from 1500 units/hr to 1300 units/hr. Recheck heparin level in 8 hours. CBC daily while on heparin.  ? ?Oswald Hillock, PharmD ?03/13/2021,8:44 PM ? ? ?

## 2021-03-13 NOTE — ED Notes (Signed)
Pt asking to speak with vascular surgery again. Lucky Cowboy, MD office called and secure chat sent  ?

## 2021-03-13 NOTE — Progress Notes (Signed)
Patient tolerated procedure well. Ambulate to scan with walker (also uses cane).  Denies light headedness or dizzy post study. Found to have bilateral PEs on CT. Radiologist notified and verified. Dr. Clayborn Bigness and Dr. Mylo Red notified. 911 called. Patient and his wife aware of issue and what to expect. Report to ED charge nurse. IV intact to ED. Report given to paramedic and patient to ED via ambulance. All questions answered for patient and wife. ABC intact. No further needs.  ?

## 2021-03-13 NOTE — Consult Note (Signed)
ANTICOAGULATION CONSULT NOTE - Initial Consult ? ?Pharmacy Consult for heparin ?Indication: pulmonary embolus ? ?No Known Allergies ? ?Patient Measurements: ?Height: '5\' 10"'$  (177.8 cm) ?Weight: 93 kg (205 lb) ?IBW/kg (Calculated) : 73 ?Heparin Dosing Weight: 91.8 kg ? ?Vital Signs: ?Temp: 98.5 ?F (36.9 ?C) (03/09 1047) ?Temp Source: Oral (03/09 1047) ?BP: 136/97 (03/09 1047) ?Pulse Rate: 65 (03/09 1047) ? ?Labs: ?Recent Labs  ?  03/13/21 ?1044  ?HGB 12.4*  ?HCT 38.5*  ?PLT 256  ?APTT 33  ?LABPROT 13.9  ?INR 1.1  ?CREATININE 1.28*  ?TROPONINIHS 18*  ? ? ?Estimated Creatinine Clearance: 51.9 mL/min (A) (by C-G formula based on SCr of 1.28 mg/dL (H)). ? ? ?Medical History: ?History reviewed. No pertinent past medical history. ? ?Medications:  ?No PTA anticoagulation, only aspirin 81 mg daily  ? ?Assessment: ?81 y.o. male  with PMH significant for CHB s/p Pacemaker placement ([employer]. Jude, 2014), PVC's, HTN, HLD, DM type II and CKD stage III who presents for further evaluation with concerns for a PE seen on an outpatient CT. Pharmacy has been consulted for heparin dosing.  ? ?Baseline labs: Hgb 12.4, plts 256, aPTT 33, INR 1.1  ? ?Goal of Therapy:  ?Heparin level 0.3-0.7 units/ml ?Monitor platelets by anticoagulation protocol: Yes ?  ?Plan:  ?Give 5000 units bolus x 1 ?Start heparin infusion at 1500 units/hr ?Check anti-Xa level in 8 hours and daily while on heparin ?Continue to monitor H&H and platelets ? ?Darnelle Bos, PharmD ?03/13/2021,11:49 AM ? ? ?

## 2021-03-13 NOTE — ED Provider Notes (Signed)
? ?[facility] ?Provider Note ? ? ? Event Date/Time  ? First MD Initiated Contact with Patient 03/13/21 1035   ?  (approximate) ? ? ?History  ? ?Shortness of Breath ? ? ?HPI ? ?Charles Townsend is a 81 y.o. male  with PMH significant for CHB s/p Pacemaker placement (St. Jude, 2014), PVC's, HTN, HLD, DM type II and CKD stage III who presents for further evaluation with concerns for a PE seen on an outpatient CT coronary study done earlier today to assess because of some exertional dyspnea he has had over the last couple weeks.  Patient denies any new cough, chest pain or change in shortness of breath today.  No new headache, earache, sore throat, vomiting, diarrhea, rash or any other acute sick symptoms.  States he is only on a baby aspirin.  No history of DVT or PE. ? ?  ? ? ?Physical Exam  ?Triage Vital Signs: ?ED Triage Vitals  ?Enc Vitals Group  ?   BP   ?   Pulse   ?   Resp   ?   Temp   ?   Temp src   ?   SpO2   ?   Weight   ?   Height   ?   Head Circumference   ?   Peak Flow   ?   Pain Score   ?   Pain Loc   ?   Pain Edu?   ?   Excl. in [place]?   ? ? ?Most recent vital signs: ?Vitals:  ? 03/13/21 1047  ?BP: (!) 136/97  ?Pulse: 65  ?Resp: 20  ?Temp: 98.5 ?F (36.9 ?C)  ?SpO2: 96%  ? ? ?General: Awake, no distress.  ?CV:  Good peripheral perfusion.  2+ radial pulses. ?Resp:  Normal effort.  Clear bilaterally without any increased effort. ?Abd:  No distention.  Soft. ?Other:  Minimal lower extremity symmetric edema. ? ? ?ED Results / Procedures / Treatments  ?Labs ?(all labs ordered are listed, but only abnormal results are displayed) ?Labs Reviewed  ?RESP PANEL BY RT-PCR (FLU A&B, COVID) [id number]  ?CBC WITH DIFFERENTIAL/PLATELET  ?COMPREHENSIVE METABOLIC PANEL  ?PROTIME-INR  ?APTT  ?BRAIN NATRIURETIC PEPTIDE  ?TROPONIN I (HIGH SENSITIVITY)  ? ? ? ?EKG ? ?ECG is remarkable for a flutter with a rate of 55, right bundle branch block, nonspecific changes and Q waves in inferior leads as well as some  nonspecific changes in lateral leads without other clear evidence of acute ischemia.  Unremarkable intervals. ? ? ?RADIOLOGY ? ?I reviewed CT obtained earlier today that shows evidence of PE.  I also reviewed radiology interpretation and agree with their findings of large volume of bilateral PEs with evidence of right heart strain as well as notation of some interstitial lung disease, nonspecific and aortic atherosclerosis, emphysema and hepatic steatosis. ? ? ?PROCEDURES: ? ?Critical Care performed: Yes, see critical care procedure note(s) ? ?.Critical Care ?Performed by: Lucrezia Starch, MD ?Authorized by: Lucrezia Starch, MD  ? ?Critical care provider statement:  ?  Critical care time (minutes):  30 ?  Critical care was necessary to treat or prevent imminent or life-threatening deterioration of the following conditions:  Circulatory failure ?  Critical care was time spent personally by me on the following activities:  Development of treatment plan with patient or surrogate, discussions with consultants, evaluation of patient's response to treatment, examination of patient, ordering and review of laboratory studies, ordering and review of radiographic studies,  ordering and performing treatments and interventions, pulse oximetry, re-evaluation of patient's condition and review of old charts ? ? ?MEDICATIONS ORDERED IN ED: ?Medications - No data to display ? ? ?IMPRESSION / MDM / ASSESSMENT AND PLAN / ED COURSE  ?I reviewed the triage vital signs and the nursing notes. ?             ?               ? ?Patient presents with above-stated history exam after being referred to the emergency room following a CTA chest that was done earlier today to evaluate for some exertional dyspnea that showed bilateral PEs with evidence of right heart strain.  On arrival patient is afebrile hemodynamically stable.  He denies any history of this.  He is not in any pain and does not seem to be struggling to breathe.   ? ?ECG is  remarkable for a flutter with a rate of 55, right bundle branch block, nonspecific changes and Q waves in inferior leads as well as some nonspecific changes in lateral leads without other clear evidence of acute ischemia.  Unremarkable intervals. ? ?I reviewed CT obtained earlier today that shows evidence of PE.  I also reviewed radiology interpretation and agree with their findings of large volume of bilateral PEs with evidence of right heart strain as well as notation of some interstitial lung disease, nonspecific and aortic atherosclerosis, emphysema and hepatic steatosis. ? ?CBC shows WBC count 5.9, hemoglobin 12.4 and normal platelets.  CMP shows a creatinine of 1.28 without any other significant electrolyte or metabolic derangements.  INR and PTT are unremarkable.  Troponin is 18.  BNP only slightly elevated 122.6. ? ?Patient started on heparin.  Given evidence of right heart strain I consulted with on-call vascular surgeon Dr. Lucky Cowboy who will evaluate patient to see if he is a candidate for any intervention.  Also discussed with admitting hospitalist will place admission orders.  Lower extremity ultrasound also ordered. ? ?  ? ? ?FINAL CLINICAL IMPRESSION(S) / ED DIAGNOSES  ? ?Final diagnoses:  ?Acute pulmonary embolism with acute cor pulmonale, unspecified pulmonary embolism type ([place])  ? ? ? ?Rx / DC Orders  ? ?ED Discharge Orders   ? ? None  ? ?  ? ? ? ?Note:  This document was prepared using Dragon voice recognition software and may include unintentional dictation errors. ?  ?Lucrezia Starch, MD ?03/13/21 1146 ? ?

## 2021-03-13 NOTE — H&P (Signed)
History and Physical    Charles Townsend TMH:[id number] DOB: 06/03/1940 DOA: 03/13/2021  PCP: Leonel Ramsay, MD  Patient coming from: Dr. Etta Quill office     Chief Complaint: shortness of breath   HPI: 81 y/o M w/ PMH of HTN, HLD, DM2 who presents w/ shortness of breath x 6 months. The shortness of breath was progressively became worse recently. The shortness of breath is with exertion only. Resting makes the shortness of breath better and exertion makes the shortness of breath worse. Of note, pt recently had 5 hour car ride from [facility]. Pt does admit to a sedentary lifestyle. Pt denies any fevers, chills, chest pain, nausea, vomiting, abd pain, dysuria, urinary urgency, urinary frequency, diarrhea or constipation.   Review of Systems: As per HPI otherwise 14 point review of systems negative.   PMH: HTN, DM2, HLD  Family Hx: Father: heart disease                   Mother: kidney disease                   Brother: lung cancer                   Maternal grandmother: kidney cancer  Social Hx: Social smoker x 4 years. Quit smoking approx 50 years ago. 1 alcoholic beverage per month. Pt denies any illicit drug use. Uses a cane and walker.   Surg Hx: toe amputation, eye surgery     reports that he has never smoked. He has never used smokeless tobacco. No history on file for alcohol use and drug use.  No Known Allergies     Prior to Admission medications   Medication Sig Start Date End Date Taking? Authorizing Provider  ivabradine (CORLANOR) 7.5 MG TABS tablet Take '15mg'$  tablets TWO hours prior to cardiac CT scan. 03/04/21   Kate Sable, MD  metoprolol tartrate (LOPRESSOR) 100 MG tablet Take '100mg'$  tablet TWO hours prior to your cardiac CT scan. 03/04/21   Kate Sable, MD  atorvastatin (LIPITOR) 20 MG tablet Take 20 mg by mouth daily.    [provider]  calcium-vitamin D (OSCAL WITH D) 500-200 MG-UNIT tablet Take 1 tablet by mouth.    [provider]  COVID-19 mRNA bivalent vaccine, Pfizer, (PFIZER COVID-19 VAC BIVALENT) injection Inject into the muscle. 11/07/20   Carlyle Basques, MD  COVID-19 mRNA Vac-TriS, Pfizer, (PFIZER-BIONT COVID-19 VAC-TRIS) SUSP injection Inject into the muscle. 08/06/20   Carlyle Basques, MD  losartan (COZAAR) 25 MG tablet Take 25 mg by mouth daily.    [provider]  metFORMIN (GLUCOPHAGE) 500 MG tablet Take by mouth 4 (four) times daily.    [provider]  metoprolol-hydrochlorothiazide (LOPRESSOR HCT) 100-25 MG tablet Take 1 tablet by mouth daily.    [provider]  pantoprazole (PROTONIX) 40 MG tablet Take 40 mg by mouth daily.    [provider]  pseudoephedrine-acetaminophen (TYLENOL SINUS) 30-500 MG TABS tablet Take 1 tablet by mouth every 4 (four) hours as needed.    [provider]  vitamin B-12 (CYANOCOBALAMIN) 500 MCG tablet Take 500 mcg by mouth daily.    [provider]    Physical Exam: Vitals:   03/13/21 1045 03/13/21 1047  BP:  (!) 136/97  Pulse:  65  Resp:  20  Temp:  98.5 F (36.9 C)  TempSrc:  Oral  SpO2:  96%  Weight: 93 kg   Height: '5\' 10"'$  (1.778 m)  Constitutional: NAD, calm, comfortable Vitals:   03/13/21 1045 03/13/21 1047  BP:  (!) 136/97  Pulse:  65  Resp:  20  Temp:  98.5 F (36.9 C)  TempSrc:  Oral  SpO2:  96%  Weight: 93 kg   Height: '5\' 10"'$  (1.778 m)    Eyes: missing right eye, lids and conjunctivae normal ENMT: Mucous membranes are moist. Posterior pharynx clear of any exudate or lesions..  Neck: normal, supple Respiratory: diminished breath sounds b/l. No wheezes, rale  Cardiovascular: S1/S2+. No rubs / gallops. No extremity edema.  Abdomen: soft, no tenderness, ND. Bowel sounds positive.  Musculoskeletal: no clubbing / cyanosis. No joint deformity upper and lower extremities. Good ROM. Skin: no rashes, lesions. Neurologic: CN 2-12 grossly intact. Sensation intact. Psychiatric: Normal  judgment and insight. Alert and oriented x 4. Normal mood.     Labs on Admission: I have personally reviewed following labs and imaging studies  CBC: Recent Labs  Lab 03/13/21 1044  WBC 5.9  NEUTROABS 3.6  HGB 12.4*  HCT 38.5*  MCV 93.2  PLT 416   Basic Metabolic Panel: Recent Labs  Lab 03/13/21 1044  NA 136  K 4.8  CL 102  CO2 28  GLUCOSE 90  BUN 16  CREATININE 1.28*  CALCIUM 9.6   GFR: Estimated Creatinine Clearance: 51.9 mL/min (A) (by C-G formula based on SCr of 1.28 mg/dL (H)). Liver Function Tests: Recent Labs  Lab 03/13/21 1044  AST 17  ALT 17  ALKPHOS 42  BILITOT 0.8  PROT 7.1  ALBUMIN 3.9   No results for input(s): LIPASE, AMYLASE in the last 168 hours. No results for input(s): AMMONIA in the last 168 hours. Coagulation Profile: Recent Labs  Lab 03/13/21 1044  INR 1.1   Cardiac Enzymes: No results for input(s): CKTOTAL, CKMB, CKMBINDEX, TROPONINI in the last 168 hours. BNP (last 3 results) No results for input(s): PROBNP in the last 8760 hours. HbA1C: No results for input(s): HGBA1C in the last 72 hours. CBG: No results for input(s): GLUCAP in the last 168 hours. Lipid Profile: No results for input(s): CHOL, HDL, LDLCALC, TRIG, CHOLHDL, LDLDIRECT in the last 72 hours. Thyroid Function Tests: No results for input(s): TSH, T4TOTAL, FREET4, T3FREE, THYROIDAB in the last 72 hours. Anemia Panel: No results for input(s): VITAMINB12, FOLATE, FERRITIN, TIBC, IRON, RETICCTPCT in the last 72 hours. Urine analysis: No results found for: COLORURINE, APPEARANCEUR, [place], [place], GLUCOSEU, [place], [place], [place], PROTEINUR, UROBILINOGEN, NITRITE, LEUKOCYTESUR  Radiological Exams on Admission: CT CORONARY MORPH W/CTA COR W/SCORE W/CA W/CM &/OR WO/CM  Result Date: 03/13/2021 EXAM: OVER-READ INTERPRETATION  CT CHEST The following report is an over-read performed by radiologist Dr. Abigail Miyamoto of [facility], [place] on 03/13/2021. This  over-read does not include interpretation of cardiac or coronary anatomy or pathology. The coronary CTA interpretation by the cardiologist is attached. COMPARISON:  Chest radiograph, report only, from 02/11/2021 FINDINGS: Vascular: Aortic atherosclerosis. Bilateral pulmonary emboli, including within the branch main pulmonary artery on 04/19, throughout right greater than left lobar branches. Pulmonary artery enlargement, including at 3.3 cm. RV-LV ratio of 3.8/3.4. Mediastinum/Nodes: Borderline subcarinal adenopathy at 11 mm is likely reactive in the setting of interstitial lung disease. Lungs/Pleura: No pleural fluid. Interstitial lung disease, as evidenced by subpleural reticulation, architectural distortion, and minimal traction bronchiolectasis. Mild craniocaudal gradient. Moderate centrilobular emphysema. Upper Abdomen: Mild hepatic steatosis. Normal imaged portions of the stomach. Musculoskeletal: No acute osseous abnormality. Midthoracic spondylosis. IMPRESSION: 1. Large volume, bilateral pulmonary emboli. Positive for acute  PE with CT evidence of right heart strain (RV/LV Ratio = 1.1) consistent with at least submassive (intermediate risk) PE. The presence of right heart strain has been associated with an increased risk of morbidity and mortality. Please refer to the "PE Focused" order set in EPIC. 2. Interstitial lung disease, with differential considerations of nonspecific interstitial pneumonitis or early usual interstitial pneumonitis. Consider nonemergent pulmonary consultation and high-resolution chest CT at 6 months. 3.  Aortic Atherosclerosis (ICD10-I70.0).  Emphysema (ICD10-J43.9). 4. Hepatic steatosis The technologist was in the process of calling the referring doctor prior to this dictation. In addition, the patient was being sent to the emergency room via ambulance. He was clinically stable, in no acute distress, as of approximately 10:05 a.m. Electronically Signed   By: Abigail Miyamoto M.D.   On:  03/13/2021 10:21    EKG: Independently reviewed.   Assessment/Plan Principal Problem:   Pulmonary embolism (HCC) Acute pulmonary embolism: w/ right heart strain. Vasc surg, Dr. Lucky Cowboy, consulted in ED. Continue on IV heparin. Echo ordered. PT/OT consulted for tomorrow AM. [place] of b/l LE neg for DVT. Recent 5 hr long car ride from [facility]. Sedentary lifestyle   Likely CKD: unknown baseline Cr/GFR, currently IIIa. Avoid nephrotoxic meds   HTN: hold home dose of losartan, metoprolol, lasix   DM2: likely well controlled, last HbA1c around 7.0 as per pt. Start SSI w/ accuchecks. Hold all home anti-DM meds   HLD: continue on statin  GERD: continue on PPI   DVT prophylaxis: IV heparin drip  Code Status: full Family Communication: discussed pt's care w/ pt's family at bedside and answered their questions Disposition Plan: likely d/c back home  Consults called: vasc surg, Dr. Lucky Cowboy Admission status: observation    Wyvonnia Dusky MD [facility]  If 7PM-7AM, please contact night-coverage [email or web address]   03/13/2021, 12:02 PM

## 2021-03-13 NOTE — Plan of Care (Signed)

## 2021-03-14 ENCOUNTER — Observation Stay
Admit: 2021-03-14 | Discharge: 2021-03-14 | Disposition: A | Discharge status: Home / Self Care | Payer: Medicare Other | Attending: Internal Medicine | Admitting: Internal Medicine

## 2021-03-14 ENCOUNTER — Other Ambulatory Visit (HOSPITAL_COMMUNITY): Payer: Self-pay

## 2021-03-14 DIAGNOSIS — I10 Essential (primary) hypertension: Secondary | ICD-10-CM | POA: Diagnosis not present

## 2021-03-14 DIAGNOSIS — I2609 Other pulmonary embolism with acute cor pulmonale: Secondary | ICD-10-CM | POA: Diagnosis not present

## 2021-03-14 DIAGNOSIS — E1169 Type 2 diabetes mellitus with other specified complication: Secondary | ICD-10-CM | POA: Diagnosis not present

## 2021-03-14 LAB — ECHOCARDIOGRAM COMPLETE
AR max vel: 2.83 cm2
AV Area VTI: 3.17 cm2
AV Area mean vel: 2.68 cm2
AV Mean grad: 3 mmHg
AV Peak grad: 5.2 mmHg
Ao pk vel: 1.14 m/s
Area-P 1/2: 4.44 cm2
Height: 70 in
MV VTI: 2.83 cm2
S' Lateral: 2.41 cm
Weight: 3280 oz

## 2021-03-14 LAB — BASIC METABOLIC PANEL
Anion gap: 6 (ref 5–15)
BUN: 17 mg/dL (ref 8–23)
CO2: 27 mmol/L (ref 22–32)
Calcium: 9.3 mg/dL (ref 8.9–10.3)
Chloride: 105 mmol/L (ref 98–111)
Creatinine, Ser: 1.16 mg/dL (ref 0.61–1.24)
GFR, Estimated: >60 mL/min (ref >60)
Glucose, Bld: 78 mg/dL (ref 70–99)
Potassium: 4.1 mmol/L (ref 3.5–5.1)
Sodium: 138 mmol/L (ref 135–145)

## 2021-03-14 LAB — GLUCOSE, CAPILLARY
Glucose-Capillary: 111 mg/dL — ABNORMAL HIGH (ref 70–99)
Glucose-Capillary: 126 mg/dL — ABNORMAL HIGH (ref 70–99)
Glucose-Capillary: 183 mg/dL — ABNORMAL HIGH (ref 70–99)

## 2021-03-14 LAB — CBC
HCT: 35.7 % — ABNORMAL LOW (ref 39.0–52.0)
Hemoglobin: 11.8 g/dL — ABNORMAL LOW (ref 13.0–17.0)
MCH: 29.9 pg (ref 26.0–34.0)
MCHC: 33.1 g/dL (ref 30.0–36.0)
MCV: 90.4 fL (ref 80.0–100.0)
Platelets: 231 10*3/uL (ref 150–400)
RBC: 3.95 MIL/uL — ABNORMAL LOW (ref 4.22–5.81)
RDW: 13.2 % (ref 11.5–15.5)
WBC: 5.1 10*3/uL (ref 4.0–10.5)
nRBC: 0 % (ref 0.0–0.2)

## 2021-03-14 LAB — HEPARIN LEVEL (UNFRACTIONATED): Heparin Unfractionated: 0.87 IU/mL — ABNORMAL HIGH (ref 0.30–0.70)

## 2021-03-14 MED ORDER — APIXABAN 5 MG PO TABS
10.0000 mg | ORAL_TABLET | Freq: Two times a day (BID) | ORAL | Status: DC
  Administered 2021-03-14 – 2021-03-15 (×3): 10 mg via ORAL
  Filled 2021-03-14 (×3): qty 2

## 2021-03-14 NOTE — Evaluation (Signed)
Occupational Therapy Evaluation ?Patient Details ?Name: Charles Townsend ?MRN: [id number] ?DOB: [birth year] ?Today's Date: 03/14/2021 ? ? ?History of Present Illness Pt is an 81 y.o. male who presents to the ED for further evaluation of PE concerns following outpatient CT coronary study on 3/9 due to c/o exertional dyspnea over the last couple weeks. Admitted for Acute Pulmonary Emoblism w/ right heart strain. PmHx: Pacemaker, PVC's, HTN, HLD, T2DM, CKD.  ? ?Clinical Impression ?  ?Pt seen for OT evaluation this date. Pt was independent in all ADL and using rollator for functional mobility, living with his spouse. Pt currently supervision level for ADL transfers and mobility with 2WW and mod indep with ADL. Pt does present with deficits in activity tolerance and balance. Pt/spouse instructed in energy conservation strategies, home/routines modifications, AE/DME, IS use, pulse ox use, falls prevention, work simplification, and activity pacing. Pt/spouse verbalized understanding. No additional skilled OT services at this time. Will sign off.      ? ?Recommendations for follow up therapy are one component of a multi-disciplinary discharge planning process, led by the attending physician.  Recommendations may be updated based on patient status, additional functional criteria and insurance authorization.  ? ?Follow Up Recommendations ? No OT follow up  ?  ?Assistance Recommended at Discharge PRN  ?Patient can return home with the following Assistance with cooking/housework;Assist for transportation;Help with stairs or ramp for entrance ? ?  ?Functional Status Assessment ? Patient has had a recent decline in their functional status and demonstrates the ability to make significant improvements in function in a reasonable and predictable amount of time.  ?Equipment Recommendations ? None recommended by OT  ?  ?Recommendations for Other Services   ? ? ?  ?Precautions / Restrictions Precautions ?Precautions:  Fall ?Restrictions ?Weight Bearing Restrictions: No  ? ?  ? ?Mobility Bed Mobility ?Overal bed mobility: Modified Independent ?  ?  ?  ?  ?  ?  ?  ?  ? ?Transfers ?Overall transfer level: Needs assistance ?Equipment used: Rolling walker (2 wheels) ?Transfers: Sit to/from Stand ?Sit to Stand: Supervision ?  ?  ?  ?  ?  ?  ?  ? ?  ?Balance Overall balance assessment: Needs assistance ?Sitting-balance support: Feet supported, No upper extremity supported ?Sitting balance-Leahy Scale: Normal ?  ?  ?Standing balance support: Bilateral upper extremity supported, During functional activity, Reliant on assistive device for balance ?Standing balance-Leahy Scale: Good ?  ?  ?  ?  ?  ?  ?  ?  ?  ?  ?  ?  ?   ? ?ADL either performed or assessed with clinical judgement  ? ?ADL Overall ADL's : Modified independent ?  ?  ?  ?  ?  ?  ?  ?  ?  ?  ?  ?  ?  ?  ?  ?  ?  ?  ?  ?   ? ? ? ?Vision   ?   ?   ?Perception   ?  ?Praxis   ?  ? ?Pertinent Vitals/Pain Pain Assessment ?Pain Assessment: No/denies pain  ? ? ? ?Hand Dominance   ?  ?Extremity/Trunk Assessment Upper Extremity Assessment ?Upper Extremity Assessment: Overall WFL for tasks assessed ?  ?Lower Extremity Assessment ?Lower Extremity Assessment: Overall WFL for tasks assessed;Generalized weakness ?  ?  ?  ?Communication   ?  ?Cognition Arousal/Alertness: Awake/alert ?Behavior During Therapy: [facility] for tasks assessed/performed ?Overall Cognitive Status: Within Functional Limits for tasks assessed ?  ?  ?  ?  ?  ?  ?  ?  ?  ?  ?  ?  ?  ?  ?  ?  ?  ?  ?  ?  General Comments    ? ?  ?Exercises Other Exercises ?Other Exercises: Pt/spouse instructed in energy conservation strategies, home/routines modifications, AE/DME, IS use, and falls prevention. ?  ?Shoulder Instructions    ? ? ?Home Living Family/patient expects to be discharged to:: Private residence ?Living Arrangements: Spouse/significant other ?Available Help at Discharge: Family;Available 24 hours/day ?Type of Home: House ?  ?   ?  ?Home Layout: One level ?  ?  ?Bathroom Shower/Tub: Walk-in shower ?  ?Bathroom Toilet: Standard ?Bathroom Accessibility: Yes ?  ?Home Equipment: Rollator (4 wheels);Cane - single point ?  ?  ?  ? ?  ?Prior Functioning/Environment Prior Level of Function : Independent/Modified Independent ?  ?  ?  ?  ?  ?  ?  ?  ?  ? ?  ?  ?OT Problem List: Decreased activity tolerance;Impaired balance (sitting and/or standing) ?  ?   ?OT Treatment/Interventions:    ?  ?OT Goals(Current goals can be found in the care plan section) Acute Rehab OT Goals ?Patient Stated Goal: get better ?OT Goal Formulation: All assessment and education complete, DC therapy  ?OT Frequency:   ?  ? ?Co-evaluation   ?  ?  ?  ?  ? ?  ?AM-PAC OT "6 Clicks" Daily Activity     ?Outcome Measure Help from another person eating meals?: None ?Help from another person taking care of personal grooming?: None ?Help from another person toileting, which includes using toliet, bedpan, or urinal?: None ?Help from another person bathing (including washing, rinsing, drying)?: None ?Help from another person to put on and taking off regular upper body clothing?: None ?Help from another person to put on and taking off regular lower body clothing?: None ?6 Click Score: 24 ?  ?End of Session   ? ?Activity Tolerance: Patient tolerated treatment well ?Patient left: in bed;with call bell/phone within reach;with family/visitor present ? ?OT Visit Diagnosis: Other abnormalities of gait and mobility (R26.89)  ?              ?Time: 1583-0940 ?OT Time Calculation (min): 25 min ?Charges:  OT General Charges ?$OT Visit: 1 Visit ?OT Evaluation ?$OT Eval Low Complexity: 1 Low ?OT Treatments ?$Self Care/Home Management : 8-22 mins ? ?Ardeth Perfect., MPH, MS, OTR/L ?ascom [phone] ?03/14/21, 4:25 PM ?

## 2021-03-14 NOTE — Progress Notes (Signed)
OT Cancellation Note ? ?Patient Details ?Name: Charles Townsend ?MRN: [id number] ?DOB: 1940/08/08 ? ? ?Cancelled Treatment:    Reason Eval/Treat Not Completed: Medical issues which prohibited therapy. Consult received, chart reviewed. Per chart pt/family pending decision on vascular intervention, OT to hold until plan of care is established.  ? ?Ardeth Perfect., MPH, MS, OTR/L ?ascom [phone] ?03/14/21, 8:55 AM ? ?  ?

## 2021-03-14 NOTE — Progress Notes (Signed)
*  PRELIMINARY RESULTS* ?Echocardiogram ?2D Echocardiogram has been performed. ? ?Charles Townsend ?03/14/2021, 10:58 AM ?

## 2021-03-14 NOTE — Consult Note (Signed)
ANTICOAGULATION CONSULT NOTE  ? ?Pharmacy Consult for heparin ?Indication: pulmonary embolus ? ?Allergies  ?Allergen Reactions  ? Lisinopril Cough  ?  cough ?cough ?cough ?  ? Metoprolol Cough  ? ? ?Patient Measurements: ?Height: '5\' 10"'$  (177.8 cm) ?Weight: 93 kg (205 lb) ?IBW/kg (Calculated) : 73 ?Heparin Dosing Weight: 91.8 kg ? ?Vital Signs: ?Temp: 98 ?F (36.7 ?C) (03/10 0403) ?Temp Source: Oral (03/10 0035) ?BP: 132/74 (03/10 0403) ?Pulse Rate: 62 (03/10 0403) ? ?Labs: ?Recent Labs  ?  03/13/21 ?1044 03/13/21 ?2001 03/14/21 ?0102  ?HGB 12.4*  --  11.8*  ?HCT 38.5*  --  35.7*  ?PLT 256  --  231  ?APTT 33  --   --   ?LABPROT 13.9  --   --   ?INR 1.1  --   --   ?HEPARINUNFRC  --  0.95* 0.87*  ?CREATININE 1.28*  --  1.16  ?TROPONINIHS 18* 16  --   ? ? ? ?Estimated Creatinine Clearance: 57.2 mL/min (by C-G formula based on SCr of 1.16 mg/dL). ? ? ?Medical History: ?History reviewed. No pertinent past medical history. ? ?Medications:  ?No PTA anticoagulation, only aspirin 81 mg daily  ? ?Assessment: ?81 y.o. male  with PMH significant for CHB s/p Pacemaker placement (St. Jude, 2014), PVC's, HTN, HLD, DM type II and CKD stage III who presents for further evaluation with concerns for a PE seen on an outpatient CT. Pharmacy has been consulted for heparin dosing.  ? ?Baseline labs: Hgb 12.4, plts 256, aPTT 33, INR 1.1  ? ?3/9 2001 HL 0.95  ?3/10 0544 HL 0.87 ? ? ?Goal of Therapy:  ?Heparin level 0.3-0.7 units/ml ?Monitor platelets by anticoagulation protocol: Yes ?  ?Plan:  ?Heparin level is supratherapeutic. Will decrease heparin infusion from 1300 units/hr to 1100 units/hr. Recheck heparin level in 8 hours. CBC daily while on heparin.  ? ?Renda Rolls, PharmD, MBA ?03/14/2021 ?7:03 AM ? ? ? ?

## 2021-03-14 NOTE — Evaluation (Addendum)
Physical Therapy Evaluation ?Patient Details ?Name: Charles Townsend ?MRN: [id number] ?DOB: [birth year] ?Today's Date: 03/14/2021 ? ?History of Present Illness ? Pt is an 81 y.o. male who presents to the ED for further evaluation of PE concerns following outpatient CT coronary study on 3/9 due to c/o exertional dyspnea over the last couple weeks. Admitted for Acute Pulmonary Emoblism w/ right heart strain. PmHx: Pacemaker, PVC's, HTN, HLD, T2DM, CKD. ?  ?Clinical Impression ? Pt is awake and alert facetiming w/ wife at bedside upon PT entrance into room for evaluation today. Pt is A&Ox4 and denies any c/o pain at rest. Prior to hospitalization Pt reports he and his wife live with their daughter and son-in-law. He reports he has a Media planner" of a walker, referring to his rollator, but prefers to use his cane at baseline. ? ?Pt is able to perform bed mobility w/ modI to sit EOB. Once seated EOB he is able to perform sit to stand w/ SUPERVISION using RW. He was able to ambulate ~139f w/ CGA using RW, with c/o SOB at the end of ambulation. He is able to return to recliner w/ all needs within reach and I provided education on how to monitor SpO2 at home and the benefits to using a RW to help increase LE strength and endurance. Pt will benefit from continued skilled PT in order to increase LE strength, improve mobility/endurance, and restore PLOF. Current discharge recommendation to HHPT is appropriate due to the level of assistance required by the patient to ensure safety and improve overall function. ? ?   ? ?Recommendations for follow up therapy are one component of a multi-disciplinary discharge planning process, led by the attending physician.  Recommendations may be updated based on patient status, additional functional criteria and insurance authorization. ? ?Follow Up Recommendations Home health PT ? ?  ?Assistance Recommended at Discharge Intermittent Supervision/Assistance  ?Patient can return home with the  following ? A little help with walking and/or transfers;A little help with bathing/dressing/bathroom;Assistance with cooking/housework;Assist for transportation;Help with stairs or ramp for entrance ? ?  ?Equipment Recommendations Rolling walker (2 wheels)  ?Recommendations for Other Services ?    ?  ?Functional Status Assessment Patient has had a recent decline in their functional status and demonstrates the ability to make significant improvements in function in a reasonable and predictable amount of time.  ? ?  ?Precautions / Restrictions Precautions ?Precautions: Fall ?Restrictions ?Weight Bearing Restrictions: No  ? ?  ? ?Mobility ? Bed Mobility ?Overal bed mobility: Modified Independent ?  ?  ?  ?  ?  ?  ?  ?  ? ?Transfers ?Overall transfer level: Needs assistance ?Equipment used: Rolling walker (2 wheels) ?Transfers: Sit to/from Stand ?Sit to Stand: Supervision ?  ?  ?  ?  ?  ?  ?  ? ?Ambulation/Gait ?Ambulation/Gait assistance: CGA ?Gait Distance (Feet): 160 Feet ?Assistive device: Rolling walker (2 wheels) ?Gait Pattern/deviations: Step-through pattern, Decreased step length - right, Decreased step length - left, Decreased stride length ?Gait velocity: decreased ?  ?  ?  ? ?Stairs ?  ?  ?  ?  ?  ? ?Wheelchair Mobility ?  ? ?Modified Rankin (Stroke Patients Only) ?  ? ?  ? ?Balance Overall balance assessment: Needs assistance ?Sitting-balance support: Feet supported, No upper extremity supported ?Sitting balance-Leahy Scale: Normal ?  ?  ?Standing balance support: Bilateral upper extremity supported, During functional activity, Reliant on assistive device for balance ?Standing balance-Leahy Scale: Good ?  ?  ?  ?  ?  ?  ?  ?  ?  ?  ?  ?  ?   ? ? ? ?  Pertinent Vitals/Pain Pain Assessment ?Pain Assessment: No/denies pain  ? ? ?Home Living Family/patient expects to be discharged to:: Private residence ?Living Arrangements: Spouse/significant other ?Available Help at Discharge: Family;Available 24 hours/day ?Type  of Home: House ?  ?  ?  ?  ?Home Layout: One level ?Home Equipment: Rollator (4 wheels);Cane - single point ?   ?  ?Prior Function Prior Level of Function : Independent/Modified Independent ?  ?  ?  ?  ?  ?  ?  ?  ?  ? ? ?Hand Dominance  ?   ? ?  ?Extremity/Trunk Assessment  ? Upper Extremity Assessment ?Upper Extremity Assessment: Overall WFL for tasks assessed ?  ? ?Lower Extremity Assessment ?Lower Extremity Assessment: Overall WFL for tasks assessed;Generalized weakness ?  ? ?   ?Communication  ?    ?Cognition Arousal/Alertness: Awake/alert ?Behavior During Therapy: [facility] for tasks assessed/performed ?Overall Cognitive Status: Within Functional Limits for tasks assessed ?  ?  ?  ?  ?  ?  ?  ?  ?  ?  ?  ?  ?  ?  ?  ?  ?  ?  ?  ? ?  ?General Comments   ? ?  ?Exercises    ? ?Assessment/Plan  ?  ?PT Assessment Patient needs continued PT services  ?PT Problem List Decreased strength;Decreased mobility;Decreased coordination;Decreased safety awareness;Decreased activity tolerance;Decreased balance ? ?   ?  ?PT Treatment Interventions Therapeutic exercise;DME instruction;Gait training;Balance training;Stair training;Neuromuscular re-education;Functional mobility training;Therapeutic activities;Patient/family education   ? ?PT Goals (Current goals can be found in the Care Plan section)  ?Acute Rehab PT Goals ?Patient Stated Goal: to get better to go home ?PT Goal Formulation: With patient ?Time For Goal Achievement: 03/28/21 ?Potential to Achieve Goals: Good ? ?  ?Frequency Min 2X/week ?  ? ? ?Co-evaluation   ?  ?  ?  ?  ? ? ?  ?AM-PAC PT "6 Clicks" Mobility  ?Outcome Measure Help needed turning from your back to your side while in a flat bed without using bedrails?: None ?Help needed moving from lying on your back to sitting on the side of a flat bed without using bedrails?: None ?Help needed moving to and from a bed to a chair (including a wheelchair)?: A Little ?Help needed standing up from a chair using your arms  (e.g., wheelchair or bedside chair)?: A Little ?Help needed to walk in hospital room?: A Little ?Help needed climbing 3-5 steps with a railing? : A Little ?6 Click Score: 20 ? ?  ?End of Session   ?Activity Tolerance: Patient tolerated treatment well;Patient limited by fatigue ?Patient left: in chair;with chair alarm set;with family/visitor present;with call bell/phone within reach ?Nurse Communication: Mobility status ?PT Visit Diagnosis: Unsteadiness on feet (R26.81);History of falling (Z91.81);Muscle weakness (generalized) (M62.81);Other abnormalities of gait and mobility (R26.89) ?  ? ?Time: 1610-9604 ?PT Time Calculation (min) (ACUTE ONLY): 35 min ? ? ?Charges:     ?  ?  ?   ? ?Jonnie Kind, SPT ?03/14/2021, 3:40 PM ? ?

## 2021-03-14 NOTE — Progress Notes (Signed)
PT Cancellation Note ? ?Patient Details ?Name: Charles Townsend ?MRN: [id number] ?DOB: [birth year] ? ? ?Cancelled Treatment:    Reason Eval/Treat Not Completed: Medical issues which prohibited therapy. Consult received, chart reviewed. Per chart pt/family pending decision on vascular intervention, PT to hold until plan of care is established.  ? ? ?Lieutenant Diego PT, DPT ?8:46 AM,03/14/21 ? ?

## 2021-03-14 NOTE — Progress Notes (Signed)
PROGRESS NOTE    Charles Townsend  ZOX:[id number] DOB: Nov 22, 1940 DOA: 03/13/2021 PCP: Leonel Ramsay, MD   Assessment & Plan:   Principal Problem:   Pulmonary embolism ([place])   Acute pulmonary embolism: w/ right heart strain. Pt did not want thrombectomy. D/c IV heparin and start eliquis '10mg'$  BID x 7 days and the eliquis '5mg'$  BID x 3 months at least. Echo shows EF  55-60%, no regional wall motion abnormalities, diastolic function was indeterminate. [place] of b/l LE was neg for DVT. Recent 5 hr long car ride from [facility]. 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 Cr is trending down from day prior    HTN: will restart home dose of metoprolol, losartan & lasix at d/c   DM2: likely well controlled, last HbA1c around 7.0 as per pt. Continue on SSI w/ accuchecks    HLD: continue on statin       DVT prophylaxis: eliquis Code Status: DNR Family Communication: discussed pt's care w/ pt's wife, Charles Townsend, and answered her questions Disposition Plan: likely d/c back home tomorrow  Status is: Observation The patient remains OBS appropriate and will d/c before 2 midnights.    Level of care: Progressive Consultants:  Vasc surg   Procedures:   Antimicrobials:   Subjective: Pt c/o fatigue   Objective: Vitals:   03/13/21 1831 03/13/21 2009 03/14/21 0035 03/14/21 0403  BP: (!) 156/74 125/72 125/67 132/74  Pulse: 70 61 68 62  Resp: '18 18 18 18  '$ Temp: 97.8 F (36.6 C) 98 F (36.7 C) 98.3 F (36.8 C) 98 F (36.7 C)  TempSrc:   Oral   SpO2: 96% 93% 94% 95%  Weight:      Height:        Intake/Output Summary (Last 24 hours) at 03/14/2021 0740 Last data filed at 03/14/2021 0724 Gross per 24 hour  Intake 252.68 ml  Output 750 ml  Net -497.32 ml   Filed Weights   03/13/21 1045  Weight: 93 kg    Examination:  General exam: Appears calm and comfortable  Respiratory system: Clear to auscultation. Respiratory effort  normal. Cardiovascular system: S1 & S2 +. No rubs, gallops or clicks.  Gastrointestinal system: Abdomen is nondistended, soft and nontender. Normal bowel sounds heard. Central nervous system: Alert and oriented. Moves all extremities  Psychiatry: Judgement and insight appear normal. Appropriate mood and affect      Data Reviewed: I have personally reviewed following labs and imaging studies  CBC: Recent Labs  Lab 03/13/21 1044 03/14/21 0544  WBC 5.9 5.1  NEUTROABS 3.6  --   HGB 12.4* 11.8*  HCT 38.5* 35.7*  MCV 93.2 90.4  PLT 256 811   Basic Metabolic Panel: Recent Labs  Lab 03/13/21 1044 03/14/21 0544  NA 136 138  K 4.8 4.1  CL 102 105  CO2 28 27  GLUCOSE 90 78  BUN 16 17  CREATININE 1.28* 1.16  CALCIUM 9.6 9.3   GFR: Estimated Creatinine Clearance: 57.2 mL/min (by C-G formula based on SCr of 1.16 mg/dL). Liver Function Tests: Recent Labs  Lab 03/13/21 1044  AST 17  ALT 17  ALKPHOS 42  BILITOT 0.8  PROT 7.1  ALBUMIN 3.9   No results for input(s): LIPASE, AMYLASE in the last 168 hours. No results for input(s): AMMONIA in the last 168 hours. Coagulation Profile: Recent Labs  Lab 03/13/21 1044  INR 1.1   Cardiac Enzymes: No results for input(s): CKTOTAL, CKMB, CKMBINDEX, TROPONINI in  the last 168 hours. BNP (last 3 results) No results for input(s): PROBNP in the last 8760 hours. HbA1C: No results for input(s): HGBA1C in the last 72 hours. CBG: Recent Labs  Lab 03/13/21 1755 03/13/21 2143  GLUCAP 124* 75   Lipid Profile: No results for input(s): CHOL, HDL, LDLCALC, TRIG, CHOLHDL, LDLDIRECT in the last 72 hours. Thyroid Function Tests: No results for input(s): TSH, T4TOTAL, FREET4, T3FREE, THYROIDAB in the last 72 hours. Anemia Panel: No results for input(s): VITAMINB12, FOLATE, FERRITIN, TIBC, IRON, RETICCTPCT in the last 72 hours. Sepsis Labs: No results for input(s): PROCALCITON, LATICACIDVEN in the last 168 hours.  Recent Results (from  the past 240 hour(s))  Resp Panel by RT-PCR (Flu A&B, Covid) Nasopharyngeal Swab     Status: None   Collection Time: 03/13/21 10:47 AM   Specimen: Nasopharyngeal Swab; Nasopharyngeal(NP) swabs in vial transport medium  Result Value Ref Range Status   SARS Coronavirus 2 by RT PCR NEGATIVE NEGATIVE Final    Comment: (NOTE) SARS-CoV-2 target nucleic acids are NOT DETECTED.  The SARS-CoV-2 RNA is generally detectable in upper respiratory specimens during the acute phase of infection. The lowest concentration of SARS-CoV-2 viral copies this assay can detect is 138 copies/mL. A negative result does not preclude SARS-Cov-2 infection and should not be used as the sole basis for treatment or other patient management decisions. A negative result may occur with  improper specimen collection/handling, submission of specimen other than nasopharyngeal swab, presence of viral mutation(s) within the areas targeted by this assay, and inadequate number of viral copies(<138 copies/mL). A negative result must be combined with clinical observations, patient history, and epidemiological information. The expected result is Negative.  Fact Sheet for Patients:  EntrepreneurPulse.com.au  Fact Sheet for Healthcare Providers:  IncredibleEmployment.be  This test is no t yet approved or cleared by the [place] FDA and  has been authorized for detection and/or diagnosis of SARS-CoV-2 by FDA under an Emergency Use Authorization (EUA). This EUA will remain  in effect (meaning this test can be used) for the duration of the COVID-19 declaration under Section 564(b)(1) of the Act, 21 U.S.C.section 360bbb-3(b)(1), unless the authorization is terminated  or revoked sooner.       Influenza A by PCR NEGATIVE NEGATIVE Final   Influenza B by PCR NEGATIVE NEGATIVE Final    Comment: (NOTE) The Xpert Xpress SARS-CoV-2/FLU/RSV plus assay is intended as an aid in the diagnosis of  influenza from Nasopharyngeal swab specimens and should not be used as a sole basis for treatment. Nasal washings and aspirates are unacceptable for Xpert Xpress SARS-CoV-2/FLU/RSV testing.  Fact Sheet for Patients: EntrepreneurPulse.com.au  Fact Sheet for Healthcare Providers: IncredibleEmployment.be  This test is not yet approved or cleared by the [place] FDA and has been authorized for detection and/or diagnosis of SARS-CoV-2 by FDA under an Emergency Use Authorization (EUA). This EUA will remain in effect (meaning this test can be used) for the duration of the COVID-19 declaration under Section 564(b)(1) of the Act, 21 U.S.C. section 360bbb-3(b)(1), unless the authorization is terminated or revoked.  Performed at [facility], [address] [place], [place] [address]          Radiology Studies: US Venous Img Lower Bilateral  Result Date: 03/13/2021 CLINICAL DATA:  Pulmonary embolism. EXAM: BILATERAL LOWER EXTREMITY VENOUS DOPPLER ULTRASOUND TECHNIQUE: Gray-scale sonography with graded compression, as well as color Doppler and duplex ultrasound were performed to evaluate the lower extremity deep venous systems from the level of  the common femoral vein and including the common femoral, femoral, profunda femoral, popliteal and calf veins including the posterior tibial, peroneal and gastrocnemius veins when visible. The superficial great saphenous vein was also interrogated. Spectral Doppler was utilized to evaluate flow at rest and with distal augmentation maneuvers in the common femoral, femoral and popliteal veins. COMPARISON:  None. FINDINGS: RIGHT LOWER EXTREMITY Common Femoral Vein: No evidence of thrombus. Normal compressibility, respiratory phasicity and response to augmentation. Saphenofemoral Junction: No evidence of thrombus. Normal compressibility and flow on color Doppler imaging. Profunda Femoral Vein: No evidence of  thrombus. Normal compressibility and flow on color Doppler imaging. Femoral Vein: No evidence of thrombus. Normal compressibility, respiratory phasicity and response to augmentation. Popliteal Vein: No evidence of thrombus. Normal compressibility, respiratory phasicity and response to augmentation. Calf Veins: No evidence of thrombus. Normal compressibility and flow on color Doppler imaging. Superficial Great Saphenous Vein: No evidence of thrombus. Normal compressibility. Venous Reflux:  None. Other Findings:  None. LEFT LOWER EXTREMITY Common Femoral Vein: No evidence of thrombus. Normal compressibility, respiratory phasicity and response to augmentation. Saphenofemoral Junction: No evidence of thrombus. Normal compressibility and flow on color Doppler imaging. Profunda Femoral Vein: No evidence of thrombus. Normal compressibility and flow on color Doppler imaging. Femoral Vein: No evidence of thrombus. Normal compressibility, respiratory phasicity and response to augmentation. Popliteal Vein: No evidence of thrombus. Normal compressibility, respiratory phasicity and response to augmentation. Calf Veins: No evidence of thrombus. Normal compressibility and flow on color Doppler imaging. Superficial Great Saphenous Vein: No evidence of thrombus. Normal compressibility. Venous Reflux:  None. Other Findings:  None. IMPRESSION: No evidence of deep venous thrombosis in either lower extremity. Electronically Signed   By: Marijo Sanes M.D.   On: 03/13/2021 12:06   CT CORONARY MORPH W/CTA COR W/SCORE W/CA W/CM &/OR WO/CM  Addendum Date: 03/13/2021   ADDENDUM REPORT: 03/13/2021 14:15 CLINICAL DATA:  Dyspnea on exertion EXAM: Cardiac/Coronary  CTA TECHNIQUE: The patient was scanned on a Siemens Somatom go.Top scanner. : A retrospective scan was triggered in the descending thoracic aorta. Axial non-contrast 3 mm slices were carried out through the heart. The data set was analyzed on a dedicated work station and scored  using the [place]. Gantry rotation speed was 330 msecs and collimation was .6 mm. '100mg'$  of metoprolol, ivabradine '15mg'$  x 1, and 0.8 mg of sl NTG was given. The 3D data set was reconstructed in 5% intervals of the 60-95 % of the R-R cycle. Diastolic phases were analyzed on a dedicated work station using MPR, MIP and VRT modes. The patient received 75 cc of contrast. FINDINGS: Aorta: Normal size. Mild descending aorta calcifications. No dissection. Aortic Valve:  Trileaflet.  No calcifications. Coronary Arteries:  Normal coronary origin.  Right dominance. RCA is a dominant artery that gives rise to PDA and PLA. There is no plaque. Left main is a large artery that gives rise to LAD and LCX arteries. LM has no disease. LAD has calcified plaque in the proximal segment causing mild stenosis (25-49%). LCX is a non-dominant artery that gives rise to two obtuse marginal branches. There is no plaque. Other findings: Normal pulmonary vein drainage into the left atrium. Normal left atrial appendage without a thrombus. Normal size of the pulmonary artery. Bilateral thrombus noted in the PA IMPRESSION: 1. Coronary calcium score of 477. This was 52nd percentile for age and sex matched control. 2. Normal coronary origin with right dominance. 3. Calcified plaque causing mild proximal LAD stenosis (25-49%). 4. CAD-RADS  2. Mild non-obstructive CAD (25-49%). Consider non-atherosclerotic causes of chest pain. Consider preventive therapy and risk factor modification. 5. Bilateral pulmonary embolus noted. Electronically Signed   By: Kate Sable M.D.   On: 03/13/2021 14:15   Result Date: 03/13/2021 EXAM: OVER-READ INTERPRETATION  CT CHEST The following report is an over-read performed by radiologist Dr. Abigail Miyamoto of [facility], [place] on 03/13/2021. This over-read does not include interpretation of cardiac or coronary anatomy or pathology. The coronary CTA interpretation by the cardiologist is attached. COMPARISON:   Chest radiograph, report only, from 02/11/2021 FINDINGS: Vascular: Aortic atherosclerosis. Bilateral pulmonary emboli, including within the branch main pulmonary artery on 04/19, throughout right greater than left lobar branches. Pulmonary artery enlargement, including at 3.3 cm. RV-LV ratio of 3.8/3.4. Mediastinum/Nodes: Borderline subcarinal adenopathy at 11 mm is likely reactive in the setting of interstitial lung disease. Lungs/Pleura: No pleural fluid. Interstitial lung disease, as evidenced by subpleural reticulation, architectural distortion, and minimal traction bronchiolectasis. Mild craniocaudal gradient. Moderate centrilobular emphysema. Upper Abdomen: Mild hepatic steatosis. Normal imaged portions of the stomach. Musculoskeletal: No acute osseous abnormality. Midthoracic spondylosis. IMPRESSION: 1. Large volume, bilateral pulmonary emboli. Positive for acute PE with CT evidence of right heart strain (RV/LV Ratio = 1.1) consistent with at least submassive (intermediate risk) PE. The presence of right heart strain has been associated with an increased risk of morbidity and mortality. Please refer to the "PE Focused" order set in EPIC. 2. Interstitial lung disease, with differential considerations of nonspecific interstitial pneumonitis or early usual interstitial pneumonitis. Consider nonemergent pulmonary consultation and high-resolution chest CT at 6 months. 3.  Aortic Atherosclerosis (ICD10-I70.0).  Emphysema (ICD10-J43.9). 4. Hepatic steatosis The technologist was in the process of calling the referring doctor prior to this dictation. In addition, the patient was being sent to the emergency room via ambulance. He was clinically stable, in no acute distress, as of approximately 10:05 a.m. Electronically Signed: By: Abigail Miyamoto M.D. On: 03/13/2021 10:21        Scheduled Meds:  atorvastatin  20 mg Oral q1800   insulin aspart  0-9 Units Subcutaneous TID WC   sodium chloride flush  3 mL  Intravenous Q12H   Continuous Infusions:  heparin 1,100 Units/hr (03/14/21 0723)     LOS: 0 days    Time spent: 25 mins     Wyvonnia Dusky, MD [facility] Pager [phone]  If 7PM-7AM, please contact night-coverage 03/14/2021, 7:40 AM

## 2021-03-14 NOTE — TOC Benefit Eligibility Note (Signed)
Patient Advocate Encounter ?  ?Insurance verification completed.   ?  ?The patient is currently admitted and upon discharge could be taking ELIQUIS. ?  ?The current 30 day co-pay is, $50.  ? ?The patient is insured through [employer]. ? ? ?  ? ?

## 2021-03-15 DIAGNOSIS — I2609 Other pulmonary embolism with acute cor pulmonale: Secondary | ICD-10-CM | POA: Diagnosis not present

## 2021-03-15 DIAGNOSIS — Z794 Long term (current) use of insulin: Secondary | ICD-10-CM

## 2021-03-15 DIAGNOSIS — E1169 Type 2 diabetes mellitus with other specified complication: Secondary | ICD-10-CM | POA: Diagnosis not present

## 2021-03-15 DIAGNOSIS — N1831 Chronic kidney disease, stage 3a: Secondary | ICD-10-CM | POA: Diagnosis not present

## 2021-03-15 LAB — CBC
HCT: 36 % — ABNORMAL LOW (ref 39.0–52.0)
Hemoglobin: 12 g/dL — ABNORMAL LOW (ref 13.0–17.0)
MCH: 30.5 pg (ref 26.0–34.0)
MCHC: 33.3 g/dL (ref 30.0–36.0)
MCV: 91.4 fL (ref 80.0–100.0)
Platelets: 217 10*3/uL (ref 150–400)
RBC: 3.94 MIL/uL — ABNORMAL LOW (ref 4.22–5.81)
RDW: 12.9 % (ref 11.5–15.5)
WBC: 4.4 10*3/uL (ref 4.0–10.5)
nRBC: 0 % (ref 0.0–0.2)

## 2021-03-15 LAB — GLUCOSE, CAPILLARY
Glucose-Capillary: 138 mg/dL — ABNORMAL HIGH (ref 70–99)
Glucose-Capillary: 205 mg/dL — ABNORMAL HIGH (ref 70–99)

## 2021-03-15 LAB — BASIC METABOLIC PANEL
Anion gap: 6 (ref 5–15)
BUN: 16 mg/dL (ref 8–23)
CO2: 26 mmol/L (ref 22–32)
Calcium: 9.2 mg/dL (ref 8.9–10.3)
Chloride: 108 mmol/L (ref 98–111)
Creatinine, Ser: 1.24 mg/dL (ref 0.61–1.24)
GFR, Estimated: 58 mL/min — ABNORMAL LOW (ref >60)
Glucose, Bld: 153 mg/dL — ABNORMAL HIGH (ref 70–99)
Potassium: 4.2 mmol/L (ref 3.5–5.1)
Sodium: 140 mmol/L (ref 135–145)

## 2021-03-15 LAB — HEMOGLOBIN A1C
Hgb A1c MFr Bld: 6.7 % — ABNORMAL HIGH (ref 4.8–5.6)
Mean Plasma Glucose: 146 mg/dL

## 2021-03-15 MED ORDER — APIXABAN 5 MG PO TABS: 10.0000 mg | ORAL_TABLET | Freq: Two times a day (BID) | ORAL | 0 refills | Status: DC

## 2021-03-15 MED ORDER — APIXABAN 5 MG PO TABS: 5.0000 mg | ORAL_TABLET | Freq: Two times a day (BID) | ORAL | 0 refills | Status: DC

## 2021-03-15 NOTE — TOC Transition Note (Signed)
Transition of Care (TOC) - CM/SW Discharge Note ? ? ?Patient Details  ?Name: Charles Townsend ?MRN: [id number] ?Date of Birth: 09/21/1940 ? ?Transition of Care (TOC) CM/SW Contact:  ?Harriet Masson, RN ?Phone Number:[phone] ?03/15/2021, 12:37 PM ? ? ?Clinical Narrative:    ?Spoke with pt at bedside on recommendations for HHPT and DME. Pt declined HHPT however receptive to a rolling walker. Ordered by [employer] to be delivered directly to the pt's home verified address via Epic. Pt discharged home today. ? ?No other needs presented at this time. ? ?Final next level of care: Home/Self Care ?Barriers to Discharge: Barriers Resolved ? ? ?Patient Goals and CMS Choice ?  ?  ?Choice offered to / list presented to : Patient ? ?Discharge Placement ?  ?           ?  ?  ?Name of family member notified:  (Spoke directly to pt) ?Patient and family notified of of transfer: 03/15/21 ? ?Discharge Plan and Services ?  ?  ?           ?DME Arranged: Walker rolling ?DME Agency: [employer] ?Date DME Agency Contacted: 03/15/21 ?Time DME Agency Contacted: 1443 ?Representative spoke with at DME Agency: Delana Meyer ?HH Arranged:  ([place]) ?  ?  ?  ?  ? ?Social Determinants of Health (SDOH) Interventions ?  ? ? ?Readmission Risk Interventions ?No flowsheet data found. ? ? ? ? ?

## 2021-03-15 NOTE — Discharge Summary (Signed)
Physician Discharge Summary  Jemiah Ellenburg ZOX:[id number] DOB: Jul 23, 1940 DOA: 03/13/2021  PCP: Leonel Ramsay, MD  Admit date: 03/13/2021 Discharge date: 03/15/2021  Admitted From: home  Disposition:  home w/ home health   Recommendations for Outpatient Follow-up:  Follow up with PCP in 1 week F/u w/ cardio, Dr. Clayborn Bigness, in 1 week F/u w/ vasc surg, Dr. Lucky Cowboy, in 1-2 weeks  Home Health: yes Equipment/Devices:  Discharge Condition: stable  CODE STATUS: full  Diet recommendation: Heart Healthy / Carb Modified  Brief/Interim Summary:  81 y/o M w/ PMH of HTN, HLD, DM2 who presents w/ shortness of breath x 6 months. The shortness of breath was progressively became worse recently. The shortness of breath is with exertion only. Resting makes the shortness of breath better and exertion makes the shortness of breath worse. Of note, pt recently had 5 hour car ride from [facility]. Pt does admit to a sedentary lifestyle. Pt denies any fevers, chills, chest pain, nausea, vomiting, abd pain, dysuria, urinary urgency, urinary frequency, diarrhea or constipation.   Pt was found to have acute pulmonary embolism w/ right heart strain. Pt did not want have a thrombectomy as per vascular surg. Pt was initially put on IV heparin and then transition eliquis prior to d/c. Echo was done and showed EF 55-60%, no regional wall motion abnormalities & diastolic function was indeterminate. [place] of b/l LE was neg for DVT. Pt tolerated eliquis well. Pt was educated on eliquis and potential ADRs especially increased bleeding risk w/ NSAIDs, aspirin, falls, and/or trauma. Pt verbalized his understanding.   Discharge Diagnoses:  Principal Problem:   Pulmonary embolism ([place])    Acute pulmonary embolism: w/ right heart strain. Pt did not want thrombectomy. D/c IV heparin and start eliquis '10mg'$  BID x 7 days and the eliquis '5mg'$  BID x 3 months at least. Echo shows EF  55-60%, no regional wall motion abnormalities, diastolic  function was indeterminate. [place] of b/l LE was neg for DVT. Recent 5 hr long car ride from [facility]. 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 Cr is labile    HTN: will restart home dose of metoprolol, losartan & lasix at d/c   DM2: likely well controlled, HbA1c 6.7. Continue on SSI w/ accuchecks    HLD: continue on statin   Discharge Instructions  Discharge Instructions     Diet - low sodium heart healthy   Complete by: As directed    Discharge instructions   Complete by: As directed    F/u w/ PCP in 1 week. F/u cardio, Dr. Clayborn Bigness, in 1 week. F/u w/ vascular surg, Dr. Lucky Cowboy in 1-2 weeks. Continue on eliquis '10mg'$  BID x 6 days more and then start eliquis '5mg'$  BID. For more refills of the eliquis, your PCP will provide you with those   Increase activity slowly   Complete by: As directed       Allergies as of 03/15/2021       Reactions   Lisinopril Cough   cough cough cough   Metoprolol Cough        Medication List     STOP taking these medications    metoprolol tartrate 100 MG tablet Commonly known as: Hospital doctor COVID-19 Vac Bivalent injection Generic drug: COVID-19 mRNA bivalent vaccine Therapist, music)   Pfizer-BioNT COVID-19 Vac-TriS Susp injection Generic drug: COVID-19 mRNA Vac-TriS (Pfizer)   pseudoephedrine-acetaminophen 30-500 MG Tabs tablet Commonly known as: TYLENOL SINUS  TAKE these medications    apixaban 5 MG Tabs tablet Commonly known as: ELIQUIS Take 2 tablets (10 mg total) by mouth 2 (two) times daily for 6 days.   apixaban 5 MG Tabs tablet Commonly known as: ELIQUIS Take 1 tablet (5 mg total) by mouth 2 (two) times daily. Only start the script after you have completed eliquis '10mg'$  BID x 6 days.   aspirin EC 81 MG tablet Take 81 mg by mouth daily. Swallow whole.   atorvastatin 20 MG tablet Commonly known as: LIPITOR Take 20 mg by mouth daily.   Basaglar KwikPen 100 UNIT/ML Inject 32 Units  into the skin daily.   calcium-vitamin D 500-200 MG-UNIT tablet Commonly known as: OSCAL WITH D Take 1 tablet by mouth.   furosemide 20 MG tablet Commonly known as: LASIX Take 20 mg by mouth daily.   ivabradine 7.5 MG Tabs tablet Commonly known as: Corlanor Take '15mg'$  tablets TWO hours prior to cardiac CT scan.   losartan 25 MG tablet Commonly known as: COZAAR Take 25 mg by mouth daily.   metFORMIN 500 MG 24 hr tablet Commonly known as: GLUCOPHAGE-XR Take 2,000 mg by mouth at bedtime.   metoprolol succinate 25 MG 24 hr tablet Commonly known as: TOPROL-XL Take 12.5 mg by mouth daily.   pantoprazole 40 MG tablet Commonly known as: PROTONIX Take 40 mg by mouth daily.   vitamin B-12 500 MCG tablet Commonly known as: CYANOCOBALAMIN Take 500 mcg by mouth daily.        Follow-up Information     Dew, Erskine Squibb, MD Follow up in 2 week(s).   Specialties: Vascular Surgery, Radiology, Interventional Cardiology Why: F/u in 1-2 weeks Contact information: [place] [address] [phone]         Leonel Ramsay, MD Follow up in 1 week(s).   Specialty: Infectious Diseases Contact information: [place] [address] [phone]         Lujean Amel D, MD Follow up in 1 week(s).   Specialties: Cardiology, Internal Medicine Contact information: [place] [address] [phone]                Allergies  Allergen Reactions   Lisinopril Cough    cough cough cough    Metoprolol Cough    Consultations: Vascular surg    Procedures/Studies: US Venous Img Lower Bilateral  Result Date: 03/13/2021 CLINICAL DATA:  Pulmonary embolism. EXAM: BILATERAL LOWER EXTREMITY VENOUS DOPPLER ULTRASOUND TECHNIQUE: Gray-scale sonography with graded compression, as well as color Doppler and duplex ultrasound were performed to evaluate the lower extremity deep venous systems from the level of the common  femoral vein and including the common femoral, femoral, profunda femoral, popliteal and calf veins including the posterior tibial, peroneal and gastrocnemius veins when visible. The superficial great saphenous vein was also interrogated. Spectral Doppler was utilized to evaluate flow at rest and with distal augmentation maneuvers in the common femoral, femoral and popliteal veins. COMPARISON:  None. FINDINGS: RIGHT LOWER EXTREMITY Common Femoral Vein: No evidence of thrombus. Normal compressibility, respiratory phasicity and response to augmentation. Saphenofemoral Junction: No evidence of thrombus. Normal compressibility and flow on color Doppler imaging. Profunda Femoral Vein: No evidence of thrombus. Normal compressibility and flow on color Doppler imaging. Femoral Vein: No evidence of thrombus. Normal compressibility, respiratory phasicity and response to augmentation. Popliteal Vein: No evidence of thrombus. Normal compressibility, respiratory phasicity and response to augmentation. Calf Veins: No evidence of thrombus. Normal compressibility  and flow on color Doppler imaging. Superficial Great Saphenous Vein: No evidence of thrombus. Normal compressibility. Venous Reflux:  None. Other Findings:  None. LEFT LOWER EXTREMITY Common Femoral Vein: No evidence of thrombus. Normal compressibility, respiratory phasicity and response to augmentation. Saphenofemoral Junction: No evidence of thrombus. Normal compressibility and flow on color Doppler imaging. Profunda Femoral Vein: No evidence of thrombus. Normal compressibility and flow on color Doppler imaging. Femoral Vein: No evidence of thrombus. Normal compressibility, respiratory phasicity and response to augmentation. Popliteal Vein: No evidence of thrombus. Normal compressibility, respiratory phasicity and response to augmentation. Calf Veins: No evidence of thrombus. Normal compressibility and flow on color Doppler imaging. Superficial Great Saphenous Vein: No  evidence of thrombus. Normal compressibility. Venous Reflux:  None. Other Findings:  None. IMPRESSION: No evidence of deep venous thrombosis in either lower extremity. Electronically Signed   By: Marijo Sanes M.D.   On: 03/13/2021 12:06   CT CORONARY MORPH W/CTA COR W/SCORE W/CA W/CM &/OR WO/CM  Addendum Date: 03/13/2021   ADDENDUM REPORT: 03/13/2021 14:15 CLINICAL DATA:  Dyspnea on exertion EXAM: Cardiac/Coronary  CTA TECHNIQUE: The patient was scanned on a Siemens Somatom go.Top scanner. : A retrospective scan was triggered in the descending thoracic aorta. Axial non-contrast 3 mm slices were carried out through the heart. The data set was analyzed on a dedicated work station and scored using the Calera. Gantry rotation speed was 330 msecs and collimation was .6 mm. '100mg'$  of metoprolol, ivabradine '15mg'$  x 1, and 0.8 mg of sl NTG was given. The 3D data set was reconstructed in 5% intervals of the 60-95 % of the R-R cycle. Diastolic phases were analyzed on a dedicated work station using MPR, MIP and VRT modes. The patient received 75 cc of contrast. FINDINGS: Aorta: Normal size. Mild descending aorta calcifications. No dissection. Aortic Valve:  Trileaflet.  No calcifications. Coronary Arteries:  Normal coronary origin.  Right dominance. RCA is a dominant artery that gives rise to PDA and PLA. There is no plaque. Left main is a large artery that gives rise to LAD and LCX arteries. LM has no disease. LAD has calcified plaque in the proximal segment causing mild stenosis (25-49%). LCX is a non-dominant artery that gives rise to two obtuse marginal branches. There is no plaque. Other findings: Normal pulmonary vein drainage into the left atrium. Normal left atrial appendage without a thrombus. Normal size of the pulmonary artery. Bilateral thrombus noted in the PA IMPRESSION: 1. Coronary calcium score of 477. This was 52nd percentile for age and sex matched control. 2. Normal coronary origin with right  dominance. 3. Calcified plaque causing mild proximal LAD stenosis (25-49%). 4. CAD-RADS 2. Mild non-obstructive CAD (25-49%). Consider non-atherosclerotic causes of chest pain. Consider preventive therapy and risk factor modification. 5. Bilateral pulmonary embolus noted. Electronically Signed   By: Kate Sable M.D.   On: 03/13/2021 14:15   Result Date: 03/13/2021 EXAM: OVER-READ INTERPRETATION  CT CHEST The following report is an over-read performed by radiologist Dr. Abigail Miyamoto of [facility], [place] on 03/13/2021. This over-read does not include interpretation of cardiac or coronary anatomy or pathology. The coronary CTA interpretation by the cardiologist is attached. COMPARISON:  Chest radiograph, report only, from 02/11/2021 FINDINGS: Vascular: Aortic atherosclerosis. Bilateral pulmonary emboli, including within the branch main pulmonary artery on 04/19, throughout right greater than left lobar branches. Pulmonary artery enlargement, including at 3.3 cm. RV-LV ratio of 3.8/3.4. Mediastinum/Nodes: Borderline subcarinal adenopathy at 11 mm is likely reactive in the setting  of interstitial lung disease. Lungs/Pleura: No pleural fluid. Interstitial lung disease, as evidenced by subpleural reticulation, architectural distortion, and minimal traction bronchiolectasis. Mild craniocaudal gradient. Moderate centrilobular emphysema. Upper Abdomen: Mild hepatic steatosis. Normal imaged portions of the stomach. Musculoskeletal: No acute osseous abnormality. Midthoracic spondylosis. IMPRESSION: 1. Large volume, bilateral pulmonary emboli. Positive for acute PE with CT evidence of right heart strain (RV/LV Ratio = 1.1) consistent with at least submassive (intermediate risk) PE. The presence of right heart strain has been associated with an increased risk of morbidity and mortality. Please refer to the "PE Focused" order set in EPIC. 2. Interstitial lung disease, with differential considerations of nonspecific  interstitial pneumonitis or early usual interstitial pneumonitis. Consider nonemergent pulmonary consultation and high-resolution chest CT at 6 months. 3.  Aortic Atherosclerosis (ICD10-I70.0).  Emphysema (ICD10-J43.9). 4. Hepatic steatosis The technologist was in the process of calling the referring doctor prior to this dictation. In addition, the patient was being sent to the emergency room via ambulance. He was clinically stable, in no acute distress, as of approximately 10:05 a.m. Electronically Signed: By: Abigail Miyamoto M.D. On: 03/13/2021 10:21   ECHOCARDIOGRAM COMPLETE  Result Date: 03/14/2021    ECHOCARDIOGRAM REPORT   Patient Name:   KOICHI PLATTE Date of Exam: 03/14/2021 Medical Rec #:  [id number]     Height:       70.0 in Accession #:    [id number]    Weight:       205.0 lb Date of Birth:  [birth year]    BSA:          2.109 m Patient Age:    81 years      BP:           132/74 mmHg Patient Gender: M             HR:           65 bpm. Exam Location:  [facility] Procedure: 2D Echo, Color Doppler and Cardiac Doppler Indications:     I26.09 Pulmonary Embolus  History:         Patient has no prior history of Echocardiogram examinations.                  Pacemaker, Signs/Symptoms:Shortness of Breath; Risk                  Factors:Hypertension and Dyslipidemia.  Sonographer:     Charmayne Sheer Referring Phys:  [id number] Wyvonnia Dusky Diagnosing Phys: Donnelly Angelica  Sonographer Comments: Suboptimal apical window and no subcostal window. IMPRESSIONS  1. Left ventricular ejection fraction, by estimation, is 55 to 60%. The left ventricle has normal function. The left ventricle has no regional wall motion abnormalities. Left ventricular diastolic parameters are indeterminate.  2. Right ventricular systolic function was not well visualized. The right ventricular size is not well visualized.  3. The mitral valve is normal in structure. Mild mitral valve regurgitation. No evidence of mitral stenosis.  4. The aortic valve is normal  in structure. Aortic valve regurgitation is not visualized. No aortic stenosis is present.  5. Aortic dilatation noted. There is borderline dilatation of the aortic root, measuring 37 mm. Conclusion(s)/Recommendation(s): Technically difficult study. FINDINGS  Left Ventricle: Left ventricular ejection fraction, by estimation, is 55 to 60%. The left ventricle has normal function. The left ventricle has no regional wall motion abnormalities. The left ventricular internal cavity size was normal in size. There is  no left ventricular hypertrophy. Abnormal (paradoxical) septal motion, consistent with left  bundle branch block. Left ventricular diastolic parameters are indeterminate. Right Ventricle: The right ventricular size is not well visualized. Right ventricular systolic function was not well visualized. Left Atrium: Left atrial size was normal in size. Right Atrium: Right atrial size was not well visualized. Pericardium: There is no evidence of pericardial effusion. Mitral Valve: The mitral valve is normal in structure. Mild mitral valve regurgitation. No evidence of mitral valve stenosis. MV peak gradient, 5.1 mmHg. The mean mitral valve gradient is 2.0 mmHg. Tricuspid Valve: The tricuspid valve is grossly normal. Tricuspid valve regurgitation is trivial. Aortic Valve: The aortic valve is normal in structure. Aortic valve regurgitation is not visualized. No aortic stenosis is present. Aortic valve mean gradient measures 3.0 mmHg. Aortic valve peak gradient measures 5.2 mmHg. Aortic valve area, by VTI measures 3.17 cm. Pulmonic Valve: The pulmonic valve was not well visualized. Pulmonic valve regurgitation is not visualized. No evidence of pulmonic stenosis. Aorta: Aortic dilatation noted. There is borderline dilatation of the aortic root, measuring 37 mm. Venous: The inferior vena cava was not well visualized. IAS/Shunts: The interatrial septum was not well visualized.  LEFT VENTRICLE PLAX 2D LVIDd:         3.75 cm    Diastology LVIDs:         2.41 cm   LV e' medial:    3.70 cm/s LV PW:         1.29 cm   LV E/e' medial:  31.1 LV IVS:        0.98 cm   LV e' lateral:   6.31 cm/s LVOT diam:     2.20 cm   LV E/e' lateral: 18.2 LV SV:         57 LV SV Index:   27 LVOT Area:     3.80 cm  RIGHT VENTRICLE RV Basal diam:  2.80 cm LEFT ATRIUM             Index        RIGHT ATRIUM           Index LA diam:        3.30 cm 1.56 cm/m   RA Area:     16.40 cm LA Vol (A2C):   24.9 ml 11.80 ml/m  RA Volume:   37.80 ml  17.92 ml/m LA Vol (A4C):   44.4 ml 21.05 ml/m LA Biplane Vol: 36.6 ml 17.35 ml/m  AORTIC VALVE                    PULMONIC VALVE AV Area (Vmax):    2.83 cm     PV Vmax:       0.71 m/s AV Area (Vmean):   2.68 cm     PV Vmean:      50.900 cm/s AV Area (VTI):     3.17 cm     PV VTI:        0.137 m AV Vmax:           114.00 cm/s  PV Peak grad:  2.0 mmHg AV Vmean:          80.700 cm/s  PV Mean grad:  1.0 mmHg AV VTI:            0.181 m AV Peak Grad:      5.2 mmHg AV Mean Grad:      3.0 mmHg LVOT Vmax:         84.90 cm/s LVOT Vmean:  56.800 cm/s LVOT VTI:          0.151 m LVOT/AV VTI ratio: 0.83  AORTA Ao Root diam: 3.70 cm MITRAL VALVE MV Area (PHT): 4.44 cm     SHUNTS MV Area VTI:   2.83 cm     Systemic VTI:  0.15 m MV Peak grad:  5.1 mmHg     Systemic Diam: 2.20 cm MV Mean grad:  2.0 mmHg MV Vmax:       1.13 m/s MV Vmean:      68.6 cm/s MV Decel Time: 171 msec MV E velocity: 115.00 cm/s Donnelly Angelica Electronically signed by Donnelly Angelica Signature Date/Time: 03/14/2021/1:31:23 PM    Final    (Echo, Carotid, EGD, Colonoscopy, ERCP)    Subjective: Pt denies any complaints    Discharge Exam: Vitals:   03/15/21 0502 03/15/21 0738  BP: 133/73 (!) 147/85  Pulse: 60 61  Resp: 18 18  Temp: 97.7 F (36.5 C) 98 F (36.7 C)  SpO2: 96% 97%   Vitals:   03/14/21 1602 03/14/21 2127 03/15/21 0502 03/15/21 0738  BP: 135/89 (!) 162/78 133/73 (!) 147/85  Pulse: 76 69 60 61  Resp: '19 18 18 18  '$ Temp: 97.8 F (36.6 C)  97.8 F (36.6 C) 97.7 F (36.5 C) 98 F (36.7 C)  TempSrc:  Oral Oral   SpO2: 98% 97% 96% 97%  Weight:      Height:        General: Pt is alert, awake, not in acute distress Cardiovascular: S1/S2 +, no rubs, no gallops Respiratory: CTA bilaterally, no wheezing, no rhonchi Abdominal: Soft, NT, ND, bowel sounds + Extremities:  no cyanosis    The results of significant diagnostics from this hospitalization (including imaging, microbiology, ancillary and laboratory) are listed below for reference.     Microbiology: Recent Results (from the past 240 hour(s))  Resp Panel by RT-PCR (Flu A&B, Covid) Nasopharyngeal Swab     Status: None   Collection Time: 03/13/21 10:47 AM   Specimen: Nasopharyngeal Swab; Nasopharyngeal(NP) swabs in vial transport medium  Result Value Ref Range Status   SARS Coronavirus 2 by RT PCR NEGATIVE NEGATIVE Final    Comment: (NOTE) SARS-CoV-2 target nucleic acids are NOT DETECTED.  The SARS-CoV-2 RNA is generally detectable in upper respiratory specimens during the acute phase of infection. The lowest concentration of SARS-CoV-2 viral copies this assay can detect is 138 copies/mL. A negative result does not preclude SARS-Cov-2 infection and should not be used as the sole basis for treatment or other patient management decisions. A negative result may occur with  improper specimen collection/handling, submission of specimen other than nasopharyngeal swab, presence of viral mutation(s) within the areas targeted by this assay, and inadequate number of viral copies(<138 copies/mL). A negative result must be combined with clinical observations, patient history, and epidemiological information. The expected result is Negative.  Fact Sheet for Patients:  EntrepreneurPulse.com.au  Fact Sheet for Healthcare Providers:  IncredibleEmployment.be  This test is no t yet approved or cleared by the [place] FDA and  has been  authorized for detection and/or diagnosis of SARS-CoV-2 by FDA under an Emergency Use Authorization (EUA). This EUA will remain  in effect (meaning this test can be used) for the duration of the COVID-19 declaration under Section 564(b)(1) of the Act, 21 U.S.C.section 360bbb-3(b)(1), unless the authorization is terminated  or revoked sooner.       Influenza A by PCR NEGATIVE NEGATIVE Final   Influenza B by PCR NEGATIVE  NEGATIVE Final    Comment: (NOTE) The Xpert Xpress SARS-CoV-2/FLU/RSV plus assay is intended as an aid in the diagnosis of influenza from Nasopharyngeal swab specimens and should not be used as a sole basis for treatment. Nasal washings and aspirates are unacceptable for Xpert Xpress SARS-CoV-2/FLU/RSV testing.  Fact Sheet for Patients: EntrepreneurPulse.com.au  Fact Sheet for Healthcare Providers: IncredibleEmployment.be  This test is not yet approved or cleared by the [place] FDA and has been authorized for detection and/or diagnosis of SARS-CoV-2 by FDA under an Emergency Use Authorization (EUA). This EUA will remain in effect (meaning this test can be used) for the duration of the COVID-19 declaration under Section 564(b)(1) of the Act, 21 U.S.C. section 360bbb-3(b)(1), unless the authorization is terminated or revoked.  Performed at [facility], [address] [place], [place] [address]      Labs: BNP (last 3 results) Recent Labs    02/11/21 0956 03/13/21 1044  BNP 104.8* 606.3*   Basic Metabolic Panel: Recent Labs  Lab 03/13/21 1044 03/14/21 0544 03/15/21 0438  NA 136 138 140  K 4.8 4.1 4.2  CL 102 105 108  CO2 '28 27 26  '$ GLUCOSE 90 78 153*  BUN '16 17 16  '$ CREATININE 1.28* 1.16 1.24  CALCIUM 9.6 9.3 9.2   Liver Function Tests: Recent Labs  Lab 03/13/21 1044  AST 17  ALT 17  ALKPHOS 42  BILITOT 0.8  PROT 7.1  ALBUMIN 3.9   No results for input(s): LIPASE, AMYLASE in the last  168 hours. No results for input(s): AMMONIA in the last 168 hours. CBC: Recent Labs  Lab 03/13/21 1044 03/14/21 0544 03/15/21 0438  WBC 5.9 5.1 4.4  NEUTROABS 3.6  --   --   HGB 12.4* 11.8* 12.0*  HCT 38.5* 35.7* 36.0*  MCV 93.2 90.4 91.4  PLT 256 231 217   Cardiac Enzymes: No results for input(s): CKTOTAL, CKMB, CKMBINDEX, TROPONINI in the last 168 hours. BNP: Invalid input(s): POCBNP CBG: Recent Labs  Lab 03/13/21 2143 03/14/21 0822 03/14/21 1208 03/14/21 1949 03/15/21 0735  GLUCAP 75 111* 126* 183* 138*   D-Dimer No results for input(s): DDIMER in the last 72 hours. Hgb A1c Recent Labs    03/14/21 0544  HGBA1C 6.7*   Lipid Profile No results for input(s): CHOL, HDL, LDLCALC, TRIG, CHOLHDL, LDLDIRECT in the last 72 hours. Thyroid function studies No results for input(s): TSH, T4TOTAL, T3FREE, THYROIDAB in the last 72 hours.  Invalid input(s): FREET3 Anemia work up No results for input(s): VITAMINB12, FOLATE, FERRITIN, TIBC, IRON, RETICCTPCT in the last 72 hours. Urinalysis No results found for: COLORURINE, APPEARANCEUR, [place], [place], GLUCOSEU, [place], [place], [place], PROTEINUR, UROBILINOGEN, NITRITE, LEUKOCYTESUR Sepsis Labs Invalid input(s): PROCALCITONIN,  WBC,  LACTICIDVEN Microbiology Recent Results (from the past 240 hour(s))  Resp Panel by RT-PCR (Flu A&B, Covid) Nasopharyngeal Swab     Status: None   Collection Time: 03/13/21 10:47 AM   Specimen: Nasopharyngeal Swab; Nasopharyngeal(NP) swabs in vial transport medium  Result Value Ref Range Status   SARS Coronavirus 2 by RT PCR NEGATIVE NEGATIVE Final    Comment: (NOTE) SARS-CoV-2 target nucleic acids are NOT DETECTED.  The SARS-CoV-2 RNA is generally detectable in upper respiratory specimens during the acute phase of infection. The lowest concentration of SARS-CoV-2 viral copies this assay can detect is 138 copies/mL. A negative result does not preclude SARS-Cov-2 infection and should  not be used as the sole basis for treatment or other patient management decisions. A negative result  may occur with  improper specimen collection/handling, submission of specimen other than nasopharyngeal swab, presence of viral mutation(s) within the areas targeted by this assay, and inadequate number of viral copies(<138 copies/mL). A negative result must be combined with clinical observations, patient history, and epidemiological information. The expected result is Negative.  Fact Sheet for Patients:  EntrepreneurPulse.com.au  Fact Sheet for Healthcare Providers:  IncredibleEmployment.be  This test is no t yet approved or cleared by the [place] FDA and  has been authorized for detection and/or diagnosis of SARS-CoV-2 by FDA under an Emergency Use Authorization (EUA). This EUA will remain  in effect (meaning this test can be used) for the duration of the COVID-19 declaration under Section 564(b)(1) of the Act, 21 U.S.C.section 360bbb-3(b)(1), unless the authorization is terminated  or revoked sooner.       Influenza A by PCR NEGATIVE NEGATIVE Final   Influenza B by PCR NEGATIVE NEGATIVE Final    Comment: (NOTE) The Xpert Xpress SARS-CoV-2/FLU/RSV plus assay is intended as an aid in the diagnosis of influenza from Nasopharyngeal swab specimens and should not be used as a sole basis for treatment. Nasal washings and aspirates are unacceptable for Xpert Xpress SARS-CoV-2/FLU/RSV testing.  Fact Sheet for Patients: EntrepreneurPulse.com.au  Fact Sheet for Healthcare Providers: IncredibleEmployment.be  This test is not yet approved or cleared by the [place] FDA and has been authorized for detection and/or diagnosis of SARS-CoV-2 by FDA under an Emergency Use Authorization (EUA). This EUA will remain in effect (meaning this test can be used) for the duration of the COVID-19 declaration under  Section 564(b)(1) of the Act, 21 U.S.C. section 360bbb-3(b)(1), unless the authorization is terminated or revoked.  Performed at [facility], [address] [place], [place] [address]      Time coordinating discharge: Over 30 minutes  SIGNED:   Wyvonnia Dusky, MD  [facility] 03/15/2021, 10:36 AM Pager   If 7PM-7AM, please contact night-coverage

## 2021-03-17 LAB — GLUCOSE, CAPILLARY: Glucose-Capillary: 135 mg/dL — ABNORMAL HIGH (ref 70–99)

## 2021-03-26 ENCOUNTER — Encounter: Payer: Self-pay | Admitting: *Deleted

## 2021-04-02 ENCOUNTER — Telehealth: Payer: Self-pay

## 2021-04-02 NOTE — Telephone Encounter (Signed)
Called NP to go over chart and any questions or concerns he may have in regards to is NP appt with Dr. Tasia Catchings tomorrow, 3/30. No answer, unable to leave message.  ?

## 2021-04-03 ENCOUNTER — Encounter: Payer: Self-pay | Admitting: Oncology

## 2021-04-03 ENCOUNTER — Inpatient Hospital Stay: Payer: Medicare Other | Attending: Oncology | Admitting: Oncology

## 2021-04-03 ENCOUNTER — Telehealth: Payer: Self-pay | Admitting: *Deleted

## 2021-04-03 ENCOUNTER — Inpatient Hospital Stay: Payer: Medicare Other

## 2021-04-03 VITALS — BP 125/84 | HR 91 | Temp 97.5°F | Wt 199.8 lb

## 2021-04-03 DIAGNOSIS — I2609 Other pulmonary embolism with acute cor pulmonale: Secondary | ICD-10-CM

## 2021-04-03 DIAGNOSIS — I2510 Atherosclerotic heart disease of native coronary artery without angina pectoris: Secondary | ICD-10-CM | POA: Insufficient documentation

## 2021-04-03 DIAGNOSIS — Z809 Family history of malignant neoplasm, unspecified: Secondary | ICD-10-CM

## 2021-04-03 DIAGNOSIS — Z7901 Long term (current) use of anticoagulants: Secondary | ICD-10-CM | POA: Insufficient documentation

## 2021-04-03 LAB — VITAMIN B12: Vitamin B-12: 568 pg/mL (ref 180–914)

## 2021-04-03 LAB — COMPREHENSIVE METABOLIC PANEL
ALT: 18 U/L (ref 0–44)
AST: 19 U/L (ref 15–41)
Albumin: 4.2 g/dL (ref 3.5–5.0)
Alkaline Phosphatase: 49 U/L (ref 38–126)
Anion gap: 8 (ref 5–15)
BUN: 20 mg/dL (ref 8–23)
CO2: 26 mmol/L (ref 22–32)
Calcium: 9.7 mg/dL (ref 8.9–10.3)
Chloride: 104 mmol/L (ref 98–111)
Creatinine, Ser: 1.14 mg/dL (ref 0.61–1.24)
GFR, Estimated: >60 mL/min (ref >60)
Glucose, Bld: 111 mg/dL — ABNORMAL HIGH (ref 70–99)
Potassium: 4.3 mmol/L (ref 3.5–5.1)
Sodium: 138 mmol/L (ref 135–145)
Total Bilirubin: 0.3 mg/dL (ref 0.3–1.2)
Total Protein: 7.3 g/dL (ref 6.5–8.1)

## 2021-04-03 LAB — CBC WITH DIFFERENTIAL/PLATELET
Abs Immature Granulocytes: 0.02 10*3/uL (ref 0.00–0.07)
Basophils Absolute: 0.1 10*3/uL (ref 0.0–0.1)
Basophils Relative: 1 %
Eosinophils Absolute: 0.7 10*3/uL — ABNORMAL HIGH (ref 0.0–0.5)
Eosinophils Relative: 13 %
HCT: 40.5 % (ref 39.0–52.0)
Hemoglobin: 13.4 g/dL (ref 13.0–17.0)
Immature Granulocytes: 0 %
Lymphocytes Relative: 24 %
Lymphs Abs: 1.3 10*3/uL (ref 0.7–4.0)
MCH: 30.5 pg (ref 26.0–34.0)
MCHC: 33.1 g/dL (ref 30.0–36.0)
MCV: 92.3 fL (ref 80.0–100.0)
Monocytes Absolute: 0.3 10*3/uL (ref 0.1–1.0)
Monocytes Relative: 6 %
Neutro Abs: 3 10*3/uL (ref 1.7–7.7)
Neutrophils Relative %: 56 %
Platelets: 249 10*3/uL (ref 150–400)
RBC: 4.39 MIL/uL (ref 4.22–5.81)
RDW: 13 % (ref 11.5–15.5)
WBC: 5.4 10*3/uL (ref 4.0–10.5)
nRBC: 0 % (ref 0.0–0.2)

## 2021-04-03 NOTE — Telephone Encounter (Signed)
Charles Townsend ([id number])- Called left vm- requesting a call back for this patient. ? ?Reviewed chart. Pt has a NP apt at 3pm today. I called daughter back ? ?Daughter wanted to know if 2 people could accompany patient to apt today (pt/pt's spouse and daughter [name]). Per [name], parents are hard of hearing and daughter wants to be available. ? ?Please let me know as soon as possible, so I can let the daughter know. I explained to her that I need to check with the team. I will call her back with the decision of the team. ?

## 2021-04-03 NOTE — Telephone Encounter (Signed)
Spoke with Dr. Tasia Catchings, who approved 2 care givers with patient today at the apt. ? ?I contacted the daughter back and she understands that she may attend this apt today with the patient. We are accommodating this situation this one time. She is grateful and thanked me for calling her back. ?

## 2021-04-04 ENCOUNTER — Ambulatory Visit (INDEPENDENT_AMBULATORY_CARE_PROVIDER_SITE_OTHER): Payer: Self-pay | Admitting: Vascular Surgery

## 2021-04-04 LAB — ANTIPHOSPHOLIPID SYNDROME PROF
Anticardiolipin IgG: <9 GPL U/mL (ref 0–14)
Anticardiolipin IgM: <9 MPL U/mL (ref 0–12)
DRVVT: 40 s (ref 0.0–47.0)
PTT Lupus Anticoagulant: 40.6 s (ref 0.0–43.5)

## 2021-04-05 LAB — BETA-2-GLYCOPROTEIN I ABS, IGG/M/A
Beta-2 Glyco I IgG: <9 GPI IgG units (ref 0–20)
Beta-2-Glycoprotein I IgA: 14 GPI IgA units (ref 0–25)
Beta-2-Glycoprotein I IgM: 12 GPI IgM units (ref 0–32)

## 2021-04-07 LAB — PROTHROMBIN GENE MUTATION

## 2021-04-07 NOTE — Progress Notes (Signed)
?Hematology/Oncology Consult note ?[phone] [phone] ?  ? ?   ? ? ?Patient Care Team: ?Leonel Ramsay, MD as PCP - General (Infectious Diseases) ?Earlie Server, MD as Consulting Physician (Oncology) ? ?REFERRING PROVIDER: ?Wyvonnia Dusky, MD  ?CHIEF COMPLAINTS/REASON FOR VISIT:  ?Evaluation of pulmonary embolism ? ?HISTORY OF PRESENTING ILLNESS:  ? ?Charles Townsend is a  81 y.o.  male with PMH listed below was seen in consultation at the request of  Wyvonnia Dusky, MD  for evaluation of pulmonary embolism ?03/13/2021 - 03/15/2021 patient was recently admitted due to acute pulmonary embolism.  Patient has been feeling shortness of breath for the past 6 months.  Shortness of breath is progressively worsened. ?He also had 5-hour car ride from [facility] prior.  However per patient, shortness of breath has been an ongoing issue for him even prior to the trip.  Patient has sedentary lifestyle. ?Patient had outpatient CT coronary study done on 03/13/2021 which showed incidental findings of large volume, bilateral pulmonary embolism with CT evidence of right heart strain-at least submassive PE.  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  Patient was asked to go to emergency room for further evaluation and treatments. ? ?Patient was seen by vascular surgery and declined embolectomy, patient was treated with IV heparin and then transition to Eliquis prior to discharge.  Bilateral lower extremity ultrasounds were negative for DVT.  His echocardiogram during hospitalization showed LVEF of 55-60%.  Diastolic function was indeterminate. ? ?Today patient was accompanied by wife and daughter. ?Patient reports that he tolerates anticoagulation with Eliquis.  Denies any active bleeding events.  He feels that his shortness of breath has not gotten worse, may be slightly better however not significantly improved yet. ? ?Review of Systems  ?Constitutional:  Negative for appetite change,  chills, fatigue, fever and unexpected weight change.  ?HENT:   Negative for hearing loss and voice change.   ?Eyes:  Negative for eye problems and icterus.  ?Respiratory:  Positive for shortness of breath. Negative for chest tightness and cough.   ?Cardiovascular:  Negative for chest pain and leg swelling.  ?Gastrointestinal:  Negative for abdominal distention and abdominal pain.  ?Endocrine: Negative for hot flashes.  ?Genitourinary:  Negative for difficulty urinating, dysuria and frequency.   ?Musculoskeletal:  Negative for arthralgias.  ?Skin:  Negative for itching and rash.  ?Neurological:  Negative for light-headedness and numbness.  ?Hematological:  Negative for adenopathy. Does not bruise/bleed easily.  ?Psychiatric/Behavioral:  Negative for confusion.   ? ?MEDICAL HISTORY:  ?Past Medical History:  ?Diagnosis Date  ? Diabetes mellitus ([place])   ? HLD (hyperlipidemia)   ? HTN (hypertension)   ? Pulmonary embolism ([place])   ? ? ?SURGICAL HISTORY: ?History reviewed. No pertinent surgical history. ? ?SOCIAL HISTORY: ?Social History  ? ?Socioeconomic History  ? Marital status: Married  ?  Spouse name: Not on file  ? Number of children: Not on file  ? Years of education: Not on file  ? Highest education level: Not on file  ?Occupational History  ? Not on file  ?Tobacco Use  ? Smoking status: Never  ? Smokeless tobacco: Never  ?Substance and Sexual Activity  ? Alcohol use: Not on file  ? Drug use: Not on file  ? Sexual activity: Not on file  ?Other Topics Concern  ? Not on file  ?Social History Narrative  ? Not on file  ? ?Social Determinants of Health  ? ?Financial Resource Strain: Not on  file  ?Food Insecurity: Not on file  ?Transportation Needs: Not on file  ?Physical Activity: Not on file  ?Stress: Not on file  ?Social Connections: Not on file  ?Intimate Partner Violence: Not on file  ? ? ?FAMILY HISTORY: ?Family History  ?Problem Relation Age of Onset  ? Kidney cancer Mother   ? Colon cancer Father   ? Diabetes  Sister   ? Breast cancer Sister   ? Diabetes Brother   ? Diabetes Brother   ? Diabetes Brother   ? Lung cancer Brother   ? Diabetes Maternal Grandmother   ? ? ?ALLERGIES:  is allergic to lisinopril and metoprolol. ? ?MEDICATIONS:  ?Current Outpatient Medications  ?Medication Sig Dispense Refill  ? apixaban (ELIQUIS) 5 MG TABS tablet Take 1 tablet (5 mg total) by mouth 2 (two) times daily. Only start the script after you have completed eliquis '10mg'$  BID x 6 days. 60 tablet 0  ? aspirin EC 81 MG tablet Take 81 mg by mouth daily. Swallow whole.    ? atorvastatin (LIPITOR) 20 MG tablet Take 20 mg by mouth daily.    ? calcium-vitamin D (OSCAL WITH D) 500-200 MG-UNIT tablet Take 1 tablet by mouth.    ? Insulin Glargine (BASAGLAR KWIKPEN) 100 UNIT/ML Inject 32 Units into the skin daily.    ? ivabradine (CORLANOR) 7.5 MG TABS tablet Take '15mg'$  tablets TWO hours prior to cardiac CT scan. (Patient not taking: Reported on 03/13/2021) 2 tablet 0  ? losartan (COZAAR) 25 MG tablet Take 25 mg by mouth daily.    ? metFORMIN (GLUCOPHAGE-XR) 500 MG 24 hr tablet Take 2,000 mg by mouth at bedtime.    ? metoprolol succinate (TOPROL-XL) 25 MG 24 hr tablet Take 12.5 mg by mouth daily.    ? vitamin B-12 (CYANOCOBALAMIN) 500 MCG tablet Take 500 mcg by mouth daily.    ? apixaban (ELIQUIS) 5 MG TABS tablet Take 2 tablets (10 mg total) by mouth 2 (two) times daily for 6 days. 24 tablet 0  ? furosemide (LASIX) 20 MG tablet Take 20 mg by mouth daily. (Patient not taking: Reported on 03/13/2021)    ? pantoprazole (PROTONIX) 40 MG tablet Take 40 mg by mouth daily. (Patient not taking: Reported on 04/03/2021)    ? ?No current facility-administered medications for this visit.  ? ? ? ?PHYSICAL EXAMINATION: ?ECOG PERFORMANCE STATUS: 1 - Symptomatic but completely ambulatory ?Vitals:  ? 04/03/21 1503  ?BP: 125/84  ?Pulse: 91  ?Temp: (!) 97.5 ?F (36.4 ?C)  ? ?Filed Weights  ? 04/03/21 1503  ?Weight: 199 lb 12.8 oz (90.6 kg)  ? ? ?Physical Exam ?Constitutional:    ?   General: He is not in acute distress. ?HENT:  ?   Head: Normocephalic and atraumatic.  ?Eyes:  ?   General: No scleral icterus. ?Cardiovascular:  ?   Rate and Rhythm: Normal rate and regular rhythm.  ?   Heart sounds: Normal heart sounds.  ?Pulmonary:  ?   Effort: Pulmonary effort is normal. No respiratory distress.  ?   Breath sounds: No wheezing.  ?Abdominal:  ?   General: Bowel sounds are normal. There is no distension.  ?   Palpations: Abdomen is soft.  ?Musculoskeletal:     ?   General: No deformity. Normal range of motion.  ?   Cervical back: Normal range of motion and neck supple.  ?Skin: ?   General: Skin is warm and dry.  ?   Findings: No erythema or rash.  ?  Neurological:  ?   Mental Status: He is alert and oriented to person, place, and time. Mental status is at baseline.  ?   Cranial Nerves: No cranial nerve deficit.  ?   Coordination: Coordination normal.  ?Psychiatric:     ?   Mood and Affect: Mood normal.  ? ? ?LABORATORY DATA:  ?I have reviewed the data as listed ?Lab Results  ?Component Value Date  ? WBC 5.4 04/03/2021  ? HGB 13.4 04/03/2021  ? HCT 40.5 04/03/2021  ? MCV 92.3 04/03/2021  ? PLT 249 04/03/2021  ? ?Recent Labs  ?  03/13/21 ?1044 03/14/21 ?9150 03/15/21 ?5697 04/03/21 ?1606  ?NA 136 138 140 138  ?K 4.8 4.1 4.2 4.3  ?CL 102 105 108 104  ?CO2 '28 27 26 26  '$ ?GLUCOSE 90 78 153* 111*  ?BUN '16 17 16 20  '$ ?CREATININE 1.28* 1.16 1.24 1.14  ?CALCIUM 9.6 9.3 9.2 9.7  ?GFRNONAA 56* >60 58* >60  ?PROT 7.1  --   --  7.3  ?ALBUMIN 3.9  --   --  4.2  ?AST 17  --   --  19  ?ALT 17  --   --  18  ?ALKPHOS 42  --   --  49  ?BILITOT 0.8  --   --  0.3  ? ?Iron/TIBC/Ferritin/ %Sat ?No results found for: IRON, TIBC, FERRITIN, IRONPCTSAT  ? ? ?RADIOGRAPHIC STUDIES: ?I have personally reviewed the radiological images as listed and agreed with the findings in the report. ?US Venous Img Lower Bilateral ? ?Result Date: 03/13/2021 ?CLINICAL DATA:  Pulmonary embolism. EXAM: BILATERAL LOWER EXTREMITY VENOUS DOPPLER  ULTRASOUND TECHNIQUE: Gray-scale sonography with graded compression, as well as color Doppler and duplex ultrasound were performed to evaluate the lower extremity deep venous systems from the level of the com

## 2021-04-08 LAB — FACTOR 5 LEIDEN

## 2021-04-15 ENCOUNTER — Other Ambulatory Visit (INDEPENDENT_AMBULATORY_CARE_PROVIDER_SITE_OTHER): Payer: Self-pay | Admitting: Nurse Practitioner

## 2021-04-15 DIAGNOSIS — M79604 Pain in right leg: Secondary | ICD-10-CM

## 2021-04-15 DIAGNOSIS — I739 Peripheral vascular disease, unspecified: Secondary | ICD-10-CM

## 2021-04-15 DIAGNOSIS — R0989 Other specified symptoms and signs involving the circulatory and respiratory systems: Secondary | ICD-10-CM

## 2021-04-16 ENCOUNTER — Ambulatory Visit (INDEPENDENT_AMBULATORY_CARE_PROVIDER_SITE_OTHER): Payer: Medicare Other

## 2021-04-16 ENCOUNTER — Encounter (INDEPENDENT_AMBULATORY_CARE_PROVIDER_SITE_OTHER): Payer: Self-pay | Admitting: Nurse Practitioner

## 2021-04-16 ENCOUNTER — Ambulatory Visit (INDEPENDENT_AMBULATORY_CARE_PROVIDER_SITE_OTHER): Payer: Medicare Other | Admitting: Nurse Practitioner

## 2021-04-16 VITALS — BP 135/86 | HR 95 | Resp 16 | Wt 201.8 lb

## 2021-04-16 DIAGNOSIS — R0989 Other specified symptoms and signs involving the circulatory and respiratory systems: Secondary | ICD-10-CM

## 2021-04-16 DIAGNOSIS — I2609 Other pulmonary embolism with acute cor pulmonale: Secondary | ICD-10-CM

## 2021-04-16 DIAGNOSIS — I208 Other forms of angina pectoris: Secondary | ICD-10-CM

## 2021-04-16 DIAGNOSIS — I739 Peripheral vascular disease, unspecified: Secondary | ICD-10-CM

## 2021-04-27 NOTE — Progress Notes (Signed)
? ?Subjective:  ? ? Patient ID: Charles Townsend, male    DOB: 1940-06-14, 81 y.o.   MRN: [id number] ?Chief Complaint  ?Patient presents with  ? Follow-up  ?  Ultrasound follow up  ? ? ?[name] is an 81 year old male that presented to [facility] on 03/13/2021 due to bilateral pulmonary embolism found on outpatient CT coronary study, which was done to assess for some worsening shortness of breath.Patient notes that approximately a year ago he was able to walk for a mile daily and then recently was only able to walk for a block.  He notes that initially started with weakness and discomfort in his lower extremities.  He also notes that at that time he began to feel fatigued when walking for these long distances.  He has progressively gotten short of breath when over the last few weeks prompting work-up.  The patient had an echocardiogram done by his cardiologist approximately a month ago and it was essentially normal.  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  During the patient's hospitalization he elected not to move forward with a pulmonary thrombectomy due to being able to ascertain the exact moment the pulmonary embolism began and whether or not a thrombectomy would be helpful with relief of his symptoms.  He has remained on anticoagulation without significant issue.  He follows up today for noninvasive studies to evaluate for possible arterial issues causing the pain with ambulation. ? ?Today the patient has right ABI of 1.11 on the left 1.30.  He has triphasic tibial artery waveforms with good toe waveforms bilaterally. ?  ? ? ?Review of Systems  ?Respiratory:  Positive for shortness of breath.   ?All other systems reviewed and are negative. ? ?   ?Objective:  ? Physical Exam ?Vitals reviewed.  ?HENT:  ?   Head: Normocephalic.  ?Cardiovascular:  ?   Rate and Rhythm: Normal rate.  ?   Pulses: Decreased  pulses.  ?Pulmonary:  ?   Effort: Pulmonary effort is normal.  ?Skin: ?   General: Skin is warm and dry.  ?Neurological:  ?   Mental Status: He is alert and oriented to person, place, and time.  ?Psychiatric:     ?   Mood and Affect: Mood normal.     ?   Behavior: Behavior normal.     ?   Thought Content: Thought content normal.     ?   Judgment: Judgment normal.  ? ? ?BP 135/86 (BP Location: Left Arm)   Pulse 95   Resp 16   Wt 201 lb 12.8 oz (91.5 kg)   BMI 28.96 kg/m?  ? ?Past Medical History:  ?Diagnosis Date  ? Diabetes mellitus ([place])   ? HLD (hyperlipidemia)   ? HTN (hypertension)   ? Pulmonary embolism ([place])   ? ? ?Social History  ? ?Socioeconomic History  ? Marital status: Married  ?  Spouse name: Not on file  ? Number of children: Not on file  ? Years of education: Not on file  ? Highest education level: Not on file  ?Occupational History  ? Not on file  ?Tobacco Use  ? Smoking status: Never  ? Smokeless tobacco: Never  ?Substance and Sexual Activity  ? Alcohol use: Not on file  ? Drug use: Not on file  ? Sexual activity: Not on file  ?Other Topics Concern  ? Not on file  ?Social History  Narrative  ? Not on file  ? ?Social Determinants of Health  ? ?Financial Resource Strain: Not on file  ?Food Insecurity: Not on file  ?Transportation Needs: Not on file  ?Physical Activity: Not on file  ?Stress: Not on file  ?Social Connections: Not on file  ?Intimate Partner Violence: Not on file  ? ? ?History reviewed. No pertinent surgical history. ? ?Family History  ?Problem Relation Age of Onset  ? Kidney cancer Mother   ? Colon cancer Father   ? Diabetes Sister   ? Breast cancer Sister   ? Diabetes Brother   ? Diabetes Brother   ? Diabetes Brother   ? Lung cancer Brother   ? Diabetes Maternal Grandmother   ? ? ?Allergies  ?Allergen Reactions  ? Lisinopril Cough  ?  cough ?cough ?cough ?  ? Metoprolol Cough  ? ? ? ?  Latest Ref Rng & Units 04/03/2021  ?  4:06 PM 03/15/2021  ?  4:38 AM 03/14/2021  ?  5:44 AM  ?CBC  ?WBC  4.0 - 10.5 K/uL 5.4   4.4   5.1    ?Hemoglobin 13.0 - 17.0 g/dL 13.4   12.0   11.8    ?Hematocrit 39.0 - 52.0 % 40.5   36.0   35.7    ?Platelets 150 - 400 K/uL 249   217   231    ? ? ? ? ?CMP  ?   ?Component Value Date/Time  ? NA 138 04/03/2021 1606  ? K 4.3 04/03/2021 1606  ? CL 104 04/03/2021 1606  ? CO2 26 04/03/2021 1606  ? GLUCOSE 111 (H) 04/03/2021 1606  ? BUN 20 04/03/2021 1606  ? CREATININE 1.14 04/03/2021 1606  ? CALCIUM 9.7 04/03/2021 1606  ? PROT 7.3 04/03/2021 1606  ? ALBUMIN 4.2 04/03/2021 1606  ? AST 19 04/03/2021 1606  ? ALT 18 04/03/2021 1606  ? ALKPHOS 49 04/03/2021 1606  ? BILITOT 0.3 04/03/2021 1606  ? GFRNONAA >60 04/03/2021 1606  ? ? ? ?VAS [place] ABI WITH/WO TBI ? ?Result Date: 04/23/2021 ? LOWER EXTREMITY DOPPLER STUDY Patient Name:  Charles Townsend  Date of Exam:   04/16/2021 Medical Rec #: [id number]      Accession #:    [id number] Date of Birth: 1940/03/20     Patient Gender: M Patient Age:   37 years Exam Location:  [facility] Procedure:      VAS [place] ABI WITH/WO TBI Referring Phys: Leotis Pain --------------------------------------------------------------------------------  Indications: Claudication.  Performing Technologist: Almira Coaster RVS  Examination Guidelines: A complete evaluation includes at minimum, Doppler waveform signals and systolic blood pressure reading at the level of bilateral brachial, anterior tibial, and posterior tibial arteries, when vessel segments are accessible. Bilateral testing is considered an integral part of a complete examination. Photoelectric Plethysmograph (PPG) waveforms and toe systolic pressure readings are included as required and additional duplex testing as needed. Limited examinations for reoccurring indications may be performed as noted.  ABI Findings: +---------+------------------+-----+---------+--------+ Right    Rt Pressure (mmHg)IndexWaveform Comment  +---------+------------------+-----+---------+--------+ Brachial 149                                       +---------+------------------+-----+---------+--------+ ATA      152               1.02 triphasic         +---------+------------------+-----+---------+--------+ PTA  166               1.11 triphasic         +---------+------------------+-----+---------+--------+ Great Toe144               0.97 Normal            +---------+------------------+-----+---------+--------+ +---------+------------------+-----+---------+-------+ Left     Lt Pressure (mmHg)IndexWaveform Comment +---------+------------------+-----+---------+-------+ Brachial 145                                     +---------+------------------+-----+---------+-------+ ATA      168               1.13 triphasic        +---------+------------------+-----+---------+-------+ PTA      193               1.30 triphasic        +---------+------------------+-----+---------+-------+ Great Toe170               1.14 Normal           +---------+------------------+-----+---------+-------+ +-------+-----------+-----------+------------+------------+ ABI/TBIToday's ABIToday's TBIPrevious ABIPrevious TBI +-------+-----------+-----------+------------+------------+ Right  1.11       .97                                 +-------+-----------+-----------+------------+------------+ Left   1.30       1.14                                +-------+-----------+-----------+------------+------------+  Summary: Right: Resting right ankle-brachial index is within normal range. No evidence of significant right lower extremity arterial disease. The right toe-brachial index is normal. Left: Resting left ankle-brachial index is within normal range. No evidence of significant left lower extremity arterial disease. The left toe-brachial index is normal.  *See table(s) above for measurements and observations.  Electronically signed by Leotis Pain MD on 04/23/2021 at 10:04:57 AM.    Final   ? ? ?    ?Assessment & Plan:  ? ?1. Decreased pulse ?Today noninvasive studies show no evidence of significant peripheral arterial disease.  Patient will follow up on an as-needed basis. ?- VAS [place] ABI WITH/WO TBI ? ?2. 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

## 2021-04-28 ENCOUNTER — Encounter (INDEPENDENT_AMBULATORY_CARE_PROVIDER_SITE_OTHER): Payer: Self-pay | Admitting: Nurse Practitioner

## 2021-04-30 ENCOUNTER — Other Ambulatory Visit: Payer: Medicare Other

## 2021-04-30 ENCOUNTER — Encounter: Payer: Medicare Other | Admitting: Licensed Clinical Social Worker

## 2021-05-06 ENCOUNTER — Other Ambulatory Visit: Payer: Self-pay

## 2021-05-06 DIAGNOSIS — I2609 Other pulmonary embolism with acute cor pulmonale: Secondary | ICD-10-CM

## 2021-05-07 ENCOUNTER — Inpatient Hospital Stay: Payer: Medicare Other | Attending: Oncology

## 2021-05-07 DIAGNOSIS — Z7901 Long term (current) use of anticoagulants: Secondary | ICD-10-CM | POA: Diagnosis not present

## 2021-05-07 DIAGNOSIS — Z86711 Personal history of pulmonary embolism: Secondary | ICD-10-CM | POA: Diagnosis present

## 2021-05-07 DIAGNOSIS — Z7984 Long term (current) use of oral hypoglycemic drugs: Secondary | ICD-10-CM | POA: Insufficient documentation

## 2021-05-07 DIAGNOSIS — R0602 Shortness of breath: Secondary | ICD-10-CM | POA: Insufficient documentation

## 2021-05-07 DIAGNOSIS — Z79899 Other long term (current) drug therapy: Secondary | ICD-10-CM | POA: Insufficient documentation

## 2021-05-07 DIAGNOSIS — Z794 Long term (current) use of insulin: Secondary | ICD-10-CM | POA: Insufficient documentation

## 2021-05-07 DIAGNOSIS — Z7982 Long term (current) use of aspirin: Secondary | ICD-10-CM | POA: Diagnosis not present

## 2021-05-07 DIAGNOSIS — I2609 Other pulmonary embolism with acute cor pulmonale: Secondary | ICD-10-CM

## 2021-05-07 LAB — CBC WITH DIFFERENTIAL/PLATELET
Abs Immature Granulocytes: 0.02 10*3/uL (ref 0.00–0.07)
Basophils Absolute: 0 10*3/uL (ref 0.0–0.1)
Basophils Relative: 1 %
Eosinophils Absolute: 0.6 10*3/uL — ABNORMAL HIGH (ref 0.0–0.5)
Eosinophils Relative: 13 %
HCT: 40.7 % (ref 39.0–52.0)
Hemoglobin: 13.4 g/dL (ref 13.0–17.0)
Immature Granulocytes: 0 %
Lymphocytes Relative: 28 %
Lymphs Abs: 1.3 10*3/uL (ref 0.7–4.0)
MCH: 30.3 pg (ref 26.0–34.0)
MCHC: 32.9 g/dL (ref 30.0–36.0)
MCV: 92.1 fL (ref 80.0–100.0)
Monocytes Absolute: 0.3 10*3/uL (ref 0.1–1.0)
Monocytes Relative: 7 %
Neutro Abs: 2.3 10*3/uL (ref 1.7–7.7)
Neutrophils Relative %: 51 %
Platelets: 261 10*3/uL (ref 150–400)
RBC: 4.42 MIL/uL (ref 4.22–5.81)
RDW: 13.2 % (ref 11.5–15.5)
WBC: 4.5 10*3/uL (ref 4.0–10.5)
nRBC: 0 % (ref 0.0–0.2)

## 2021-05-07 LAB — COMPREHENSIVE METABOLIC PANEL
ALT: 16 U/L (ref 0–44)
AST: 19 U/L (ref 15–41)
Albumin: 4.4 g/dL (ref 3.5–5.0)
Alkaline Phosphatase: 43 U/L (ref 38–126)
Anion gap: 7 (ref 5–15)
BUN: 17 mg/dL (ref 8–23)
CO2: 26 mmol/L (ref 22–32)
Calcium: 9.9 mg/dL (ref 8.9–10.3)
Chloride: 101 mmol/L (ref 98–111)
Creatinine, Ser: 1.16 mg/dL (ref 0.61–1.24)
GFR, Estimated: >60 mL/min (ref >60)
Glucose, Bld: 97 mg/dL (ref 70–99)
Potassium: 4.8 mmol/L (ref 3.5–5.1)
Sodium: 134 mmol/L — ABNORMAL LOW (ref 135–145)
Total Bilirubin: 1.1 mg/dL (ref 0.3–1.2)
Total Protein: 7.6 g/dL (ref 6.5–8.1)

## 2021-05-14 ENCOUNTER — Inpatient Hospital Stay (HOSPITAL_BASED_OUTPATIENT_CLINIC_OR_DEPARTMENT_OTHER): Payer: Medicare Other | Admitting: Oncology

## 2021-05-14 VITALS — BP 143/86 | HR 84 | Temp 97.2°F | Wt 201.3 lb

## 2021-05-14 DIAGNOSIS — Z86711 Personal history of pulmonary embolism: Secondary | ICD-10-CM | POA: Diagnosis not present

## 2021-05-14 DIAGNOSIS — Z809 Family history of malignant neoplasm, unspecified: Secondary | ICD-10-CM | POA: Diagnosis not present

## 2021-05-14 DIAGNOSIS — I2609 Other pulmonary embolism with acute cor pulmonale: Secondary | ICD-10-CM

## 2021-05-14 DIAGNOSIS — R0602 Shortness of breath: Secondary | ICD-10-CM | POA: Diagnosis not present

## 2021-05-14 MED ORDER — APIXABAN 5 MG PO TABS: 5.0000 mg | ORAL_TABLET | Freq: Two times a day (BID) | ORAL | 0 refills | Status: DC

## 2021-05-15 ENCOUNTER — Encounter: Payer: Self-pay | Admitting: Oncology

## 2021-05-15 ENCOUNTER — Other Ambulatory Visit: Payer: Self-pay | Admitting: Oncology

## 2021-05-15 DIAGNOSIS — R0602 Shortness of breath: Secondary | ICD-10-CM | POA: Insufficient documentation

## 2021-05-15 DIAGNOSIS — Z809 Family history of malignant neoplasm, unspecified: Secondary | ICD-10-CM | POA: Insufficient documentation

## 2021-05-15 NOTE — Progress Notes (Signed)
?Hematology/Oncology Consult note ?[phone] [phone] ?  ? ?   ? ? ?Patient Care Team: ?Leonel Ramsay, MD as PCP - General (Infectious Diseases) ?Earlie Server, MD as Consulting Physician (Oncology) ? ?REFERRING PROVIDER: ?Leonel Ramsay, MD  ?CHIEF COMPLAINTS/REASON FOR VISIT:  ?Evaluation of pulmonary embolism ? ?HISTORY OF PRESENTING ILLNESS:  ? ?Charles Townsend is a  81 y.o.  male with PMH listed below was seen in consultation at the request of  Leonel Ramsay, MD  for evaluation of pulmonary embolism ?03/13/2021 - 03/15/2021 patient was recently admitted due to acute pulmonary embolism.  Patient has been feeling shortness of breath for the past 6 months.  Shortness of breath is progressively worsened. ?He also [facility].  However per patient, shortness of breath has been an ongoing issue for him even prior to the trip.  Patient has sedentary lifestyle. ?Patient had outpatient CT coronary study done on 03/13/2021 which showed incidental findings of large volume, bilateral pulmonary embolism with CT evidence of right heart strain-at least submassive PE.  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  Patient was asked to go to emergency room for further evaluation and treatments. ? ?Patient was seen by vascular surgery and declined embolectomy, patient was treated with IV heparin and then transition to Eliquis prior to discharge.  Bilateral lower extremity ultrasounds were negative for DVT.  His echocardiogram during hospitalization showed LVEF of 55-60%.  Diastolic function was indeterminate. ? ?Today patient was accompanied by wife and daughter. ?Patient reports that he tolerates anticoagulation with Eliquis.  Denies any active bleeding events.  He feels that his shortness of breath has not gotten worse, may be slightly better however not significantly improved yet. ? ?INTERVAL HISTORY ?Charles Townsend is a 81 y.o. male who has above history  reviewed by me today presents for follow up visit for management of pulmonary embolism. ?Patient is on Eliquis 5 mg twice daily.  He tolerates well.  Denies any bleeding events. ?He feels that shortness of breath may be slightly better, still not at baseline.  Yesterday he felt some mid chest discomfort associated with shortness of breath, symptom resolved.  Denies any leg swelling.  He was accompanied by his wife. ? ?Review of Systems  ?Constitutional:  Negative for appetite change, chills, fatigue, fever and unexpected weight change.  ?HENT:   Negative for hearing loss and voice change.   ?Eyes:  Negative for eye problems and icterus.  ?Respiratory:  Positive for shortness of breath. Negative for chest tightness and cough.   ?Cardiovascular:  Negative for chest pain and leg swelling.  ?Gastrointestinal:  Negative for abdominal distention and abdominal pain.  ?Endocrine: Negative for hot flashes.  ?Genitourinary:  Negative for difficulty urinating, dysuria and frequency.   ?Musculoskeletal:  Positive for gait problem. Negative for arthralgias.  ?Skin:  Negative for itching and rash.  ?Neurological:  Positive for gait problem. Negative for light-headedness and numbness.  ?Hematological:  Negative for adenopathy. Does not bruise/bleed easily.  ?Psychiatric/Behavioral:  Negative for confusion.   ? ?MEDICAL HISTORY:  ?Past Medical History:  ?Diagnosis Date  ? Diabetes mellitus ([place])   ? HLD (hyperlipidemia)   ? HTN (hypertension)   ? Pulmonary embolism ([place])   ? ? ?SURGICAL HISTORY: ?No past surgical history on file. ? ?SOCIAL HISTORY: ?Social History  ? ?Socioeconomic History  ? Marital status: Married  ?  Spouse name: Not on file  ? Number of children: Not on file  ?  Years of education: Not on file  ? Highest education level: Not on file  ?Occupational History  ? Not on file  ?Tobacco Use  ? Smoking status: Never  ? Smokeless tobacco: Never  ?Substance and Sexual Activity  ? Alcohol use: Not on file  ? Drug use: Not  on file  ? Sexual activity: Not on file  ?Other Topics Concern  ? Not on file  ?Social History Narrative  ? Not on file  ? ?Social Determinants of Health  ? ?Financial Resource Strain: Not on file  ?Food Insecurity: Not on file  ?Transportation Needs: Not on file  ?Physical Activity: Not on file  ?Stress: Not on file  ?Social Connections: Not on file  ?Intimate Partner Violence: Not on file  ? ? ?FAMILY HISTORY: ?Family History  ?Problem Relation Age of Onset  ? Kidney cancer Mother   ? Colon cancer Father   ? Diabetes Sister   ? Breast cancer Sister   ? Diabetes Brother   ? Diabetes Brother   ? Diabetes Brother   ? Lung cancer Brother   ? Diabetes Maternal Grandmother   ? ? ?ALLERGIES:  is allergic to lisinopril and metoprolol. ? ?MEDICATIONS:  ?Current Outpatient Medications  ?Medication Sig Dispense Refill  ? apixaban (ELIQUIS) 5 MG TABS tablet Take 1 tablet (5 mg total) by mouth 2 (two) times daily. 180 tablet 0  ? atorvastatin (LIPITOR) 20 MG tablet Take 20 mg by mouth daily.    ? calcium-vitamin D (OSCAL WITH D) 500-200 MG-UNIT tablet Take 1 tablet by mouth.    ? Insulin Glargine (BASAGLAR KWIKPEN) 100 UNIT/ML Inject 32 Units into the skin daily.    ? ivabradine (CORLANOR) 7.5 MG TABS tablet Take '15mg'$  tablets TWO hours prior to cardiac CT scan. (Patient not taking: Reported on 03/13/2021) 2 tablet 0  ? losartan (COZAAR) 25 MG tablet Take 25 mg by mouth daily.    ? metFORMIN (GLUCOPHAGE-XR) 500 MG 24 hr tablet Take 2,000 mg by mouth at bedtime.    ? vitamin B-12 (CYANOCOBALAMIN) 500 MCG tablet Take 500 mcg by mouth daily.    ? aspirin EC 81 MG tablet Take 81 mg by mouth daily. Swallow whole. (Patient not taking: Reported on 04/16/2021)    ? furosemide (LASIX) 20 MG tablet Take 20 mg by mouth daily. (Patient not taking: Reported on 03/13/2021)    ? metoprolol succinate (TOPROL-XL) 25 MG 24 hr tablet Take 12.5 mg by mouth daily. (Patient not taking: Reported on 04/16/2021)    ? pantoprazole (PROTONIX) 40 MG tablet Take  40 mg by mouth daily. (Patient not taking: Reported on 04/03/2021)    ? ?No current facility-administered medications for this visit.  ? ? ? ?PHYSICAL EXAMINATION: ?ECOG PERFORMANCE STATUS: 1 - Symptomatic but completely ambulatory ?Vitals:  ? 05/14/21 1147 05/14/21 1313  ?BP: (!) 173/91 (!) 143/86  ?Pulse: 84   ?Temp: (!) 97.2 ?F (36.2 ?C)   ? ?Filed Weights  ? 05/14/21 1147  ?Weight: 201 lb 4.8 oz (91.3 kg)  ? ? ?Physical Exam ?Constitutional:   ?   General: He is not in acute distress. ?HENT:  ?   Head: Normocephalic and atraumatic.  ?Eyes:  ?   General: No scleral icterus. ?   Comments: Right eye ptosis.   ?Cardiovascular:  ?   Rate and Rhythm: Normal rate and regular rhythm.  ?   Heart sounds: Normal heart sounds.  ?Pulmonary:  ?   Effort: Pulmonary effort is normal. No respiratory distress.  ?  Breath sounds: No wheezing.  ?Abdominal:  ?   General: Bowel sounds are normal. There is no distension.  ?   Palpations: Abdomen is soft.  ?Musculoskeletal:     ?   General: No deformity. Normal range of motion.  ?   Cervical back: Normal range of motion and neck supple.  ?Skin: ?   General: Skin is warm and dry.  ?   Findings: No erythema or rash.  ?Neurological:  ?   Mental Status: He is alert and oriented to person, place, and time. Mental status is at baseline.  ?   Cranial Nerves: No cranial nerve deficit.  ?   Coordination: Coordination normal.  ?Psychiatric:     ?   Mood and Affect: Mood normal.  ? ? ?LABORATORY DATA:  ?I have reviewed the data as listed ?Lab Results  ?Component Value Date  ? WBC 4.5 05/07/2021  ? HGB 13.4 05/07/2021  ? HCT 40.7 05/07/2021  ? MCV 92.1 05/07/2021  ? PLT 261 05/07/2021  ? ?Recent Labs  ?  03/13/21 ?1044 03/14/21 ?7408 03/15/21 ?1448 04/03/21 ?1606 05/07/21 ?1125  ?NA 136   < > 140 138 134*  ?K 4.8   < > 4.2 4.3 4.8  ?CL 102   < > 108 104 101  ?CO2 28   < > '26 26 26  '$ ?GLUCOSE 90   < > 153* 111* 97  ?BUN 16   < > '16 20 17  '$ ?CREATININE 1.28*   < > 1.24 1.14 1.16  ?CALCIUM 9.6   < >  9.2 9.7 9.9  ?GFRNONAA 56*   < > 58* >60 >60  ?PROT 7.1  --   --  7.3 7.6  ?ALBUMIN 3.9  --   --  4.2 4.4  ?AST 17  --   --  19 19  ?ALT 17  --   --  18 16  ?ALKPHOS 42  --   --  49 43  ?BILITOT 0.8  --

## 2021-06-12 ENCOUNTER — Ambulatory Visit
Admission: RE | Admit: 2021-06-12 | Discharge: 2021-06-12 | Disposition: A | Discharge status: Home / Self Care | Payer: Medicare Other | Source: Ambulatory Visit | Attending: Oncology | Admitting: Oncology

## 2021-06-12 DIAGNOSIS — I2609 Other pulmonary embolism with acute cor pulmonale: Secondary | ICD-10-CM | POA: Insufficient documentation

## 2021-06-12 MED ORDER — IOHEXOL 350 MG/ML SOLN
75.0000 mL | Freq: Once | INTRAVENOUS | Status: AC | PRN
  Administered 2021-06-12: 75 mL via INTRAVENOUS

## 2021-07-07 ENCOUNTER — Other Ambulatory Visit: Payer: Medicare Other

## 2021-07-07 ENCOUNTER — Inpatient Hospital Stay (HOSPITAL_BASED_OUTPATIENT_CLINIC_OR_DEPARTMENT_OTHER): Payer: Medicare Other | Admitting: Oncology

## 2021-07-07 ENCOUNTER — Ambulatory Visit: Payer: Medicare Other | Admitting: Oncology

## 2021-07-07 ENCOUNTER — Inpatient Hospital Stay: Payer: Medicare Other | Attending: Oncology

## 2021-07-07 ENCOUNTER — Encounter: Payer: Self-pay | Admitting: Oncology

## 2021-07-07 VITALS — BP 159/90 | HR 65 | Temp 96.4°F | Wt 203.0 lb

## 2021-07-07 DIAGNOSIS — I517 Cardiomegaly: Secondary | ICD-10-CM | POA: Diagnosis not present

## 2021-07-07 DIAGNOSIS — Z801 Family history of malignant neoplasm of trachea, bronchus and lung: Secondary | ICD-10-CM | POA: Diagnosis not present

## 2021-07-07 DIAGNOSIS — I700 Atherosclerosis of aorta: Secondary | ICD-10-CM | POA: Insufficient documentation

## 2021-07-07 DIAGNOSIS — R0602 Shortness of breath: Secondary | ICD-10-CM | POA: Insufficient documentation

## 2021-07-07 DIAGNOSIS — J8410 Pulmonary fibrosis, unspecified: Secondary | ICD-10-CM | POA: Insufficient documentation

## 2021-07-07 DIAGNOSIS — Z800 Family history of malignant neoplasm of digestive organs: Secondary | ICD-10-CM | POA: Insufficient documentation

## 2021-07-07 DIAGNOSIS — I2609 Other pulmonary embolism with acute cor pulmonale: Secondary | ICD-10-CM

## 2021-07-07 DIAGNOSIS — Z809 Family history of malignant neoplasm, unspecified: Secondary | ICD-10-CM

## 2021-07-07 DIAGNOSIS — I2510 Atherosclerotic heart disease of native coronary artery without angina pectoris: Secondary | ICD-10-CM | POA: Insufficient documentation

## 2021-07-07 DIAGNOSIS — Z7901 Long term (current) use of anticoagulants: Secondary | ICD-10-CM | POA: Insufficient documentation

## 2021-07-07 DIAGNOSIS — Z888 Allergy status to other drugs, medicaments and biological substances status: Secondary | ICD-10-CM | POA: Diagnosis not present

## 2021-07-07 DIAGNOSIS — Z86711 Personal history of pulmonary embolism: Secondary | ICD-10-CM | POA: Insufficient documentation

## 2021-07-07 DIAGNOSIS — Z803 Family history of malignant neoplasm of breast: Secondary | ICD-10-CM | POA: Insufficient documentation

## 2021-07-07 DIAGNOSIS — Z833 Family history of diabetes mellitus: Secondary | ICD-10-CM | POA: Insufficient documentation

## 2021-07-07 DIAGNOSIS — Z79899 Other long term (current) drug therapy: Secondary | ICD-10-CM | POA: Diagnosis not present

## 2021-07-07 DIAGNOSIS — Z8051 Family history of malignant neoplasm of kidney: Secondary | ICD-10-CM | POA: Insufficient documentation

## 2021-07-07 LAB — CBC WITH DIFFERENTIAL/PLATELET
Abs Immature Granulocytes: 0 10*3/uL (ref 0.00–0.07)
Basophils Absolute: 0 10*3/uL (ref 0.0–0.1)
Basophils Relative: 1 %
Eosinophils Absolute: 0.6 10*3/uL — ABNORMAL HIGH (ref 0.0–0.5)
Eosinophils Relative: 12 %
HCT: 38.9 % — ABNORMAL LOW (ref 39.0–52.0)
Hemoglobin: 12.7 g/dL — ABNORMAL LOW (ref 13.0–17.0)
Immature Granulocytes: 0 %
Lymphocytes Relative: 33 %
Lymphs Abs: 1.6 10*3/uL (ref 0.7–4.0)
MCH: 30.5 pg (ref 26.0–34.0)
MCHC: 32.6 g/dL (ref 30.0–36.0)
MCV: 93.3 fL (ref 80.0–100.0)
Monocytes Absolute: 0.3 10*3/uL (ref 0.1–1.0)
Monocytes Relative: 7 %
Neutro Abs: 2.3 10*3/uL (ref 1.7–7.7)
Neutrophils Relative %: 47 %
Platelets: 235 10*3/uL (ref 150–400)
RBC: 4.17 MIL/uL — ABNORMAL LOW (ref 4.22–5.81)
RDW: 13.6 % (ref 11.5–15.5)
WBC: 4.8 10*3/uL (ref 4.0–10.5)
nRBC: 0 % (ref 0.0–0.2)

## 2021-07-07 LAB — COMPREHENSIVE METABOLIC PANEL
ALT: 15 U/L (ref 0–44)
AST: 17 U/L (ref 15–41)
Albumin: 4.1 g/dL (ref 3.5–5.0)
Alkaline Phosphatase: 41 U/L (ref 38–126)
Anion gap: 6 (ref 5–15)
BUN: 18 mg/dL (ref 8–23)
CO2: 27 mmol/L (ref 22–32)
Calcium: 9.3 mg/dL (ref 8.9–10.3)
Chloride: 104 mmol/L (ref 98–111)
Creatinine, Ser: 1.25 mg/dL — ABNORMAL HIGH (ref 0.61–1.24)
GFR, Estimated: 58 mL/min — ABNORMAL LOW (ref >60)
Glucose, Bld: 90 mg/dL (ref 70–99)
Potassium: 4.5 mmol/L (ref 3.5–5.1)
Sodium: 137 mmol/L (ref 135–145)
Total Bilirubin: 1 mg/dL (ref 0.3–1.2)
Total Protein: 7.1 g/dL (ref 6.5–8.1)

## 2021-07-07 NOTE — Assessment & Plan Note (Addendum)
#  Acute bilateral submassive pulmonary embolism Based on the history that he provided that he started to have symptoms even prior to having a 5-hour road trip, he likely has unprovoked event. Recommend continue Eliquis 5 mg twice daily for a total of 6 months.  After that I recommend to decrease to Eliquis 2.5 mg twice daily for maintenance.

## 2021-07-07 NOTE — Progress Notes (Signed)
Hematology/Oncology Progress note [phone] [phone]            Patient Care Team: Leonel Ramsay, MD as PCP - General (Infectious Diseases) Earlie Server, MD as Consulting Physician (Oncology)   ASSESSMENT & PLAN:   Pulmonary embolism [facility]) #Acute bilateral submassive pulmonary embolism Based on the history that he provided that he started to have symptoms even prior to having a 5-hour road trip, he likely has unprovoked event. Recommend continue Eliquis 5 mg twice daily for a total of 6 months.  After that I recommend to decrease to Eliquis 2.5 mg twice daily for maintenance.   Pulmonary fibrosis ([place]) Follow up with pulmonology  Orders Placed This Encounter  Procedures   CBC with Differential/Platelet    Standing Status:   Future    Standing Expiration Date:   07/08/2022   Comprehensive metabolic panel    Standing Status:   Future    Standing Expiration Date:   07/07/2022   Follow up in [facility] September 2023. All questions were answered. The patient knows to call the clinic with any problems, questions or concerns.  Earlie Server, MD, PhD [facility] Hematology Oncology 07/07/2021    HISTORY OF PRESENTING ILLNESS:   Charles Townsend is a  81 y.o.  male with PMH listed below was seen in consultation at the request of  Leonel Ramsay, MD  for evaluation of pulmonary embolism 03/13/2021 - 03/15/2021 patient was recently admitted due to acute pulmonary embolism.  Patient has been feeling shortness of breath for the past 6 months.  Shortness of breath is progressively worsened. He also had 5-hour car ride from [facility] prior.  However per patient, shortness of breath has been an ongoing issue for him even prior to the trip.  Patient has sedentary lifestyle. Patient had outpatient CT coronary study done on 03/13/2021 which showed incidental findings of large volume, bilateral pulmonary embolism with CT evidence of right heart strain-at least submassive PE.   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  Patient was asked to go to emergency room for further evaluation and treatments.  Patient was seen by vascular surgery and declined embolectomy, patient was treated with IV heparin and then transition to Eliquis prior to discharge.  Bilateral lower extremity ultrasounds were negative for DVT.  His echocardiogram during hospitalization showed LVEF of 55-60%.  Diastolic function was indeterminate.  Hypercoagulable work-up is negative.  Negative factor V Leiden mutation, negative prothrombin gene mutation.  Negative antiphospholipid syndrome antibody, negative beta-2 glycoprotein antibodies.  INTERVAL HISTORY Charles Townsend is a 81 y.o. male who has above history reviewed by me today presents for follow up visit for management of pulmonary embolism. Patient is on Eliquis 5 mg twice daily.  He tolerates well.  Denies any bleeding events. Chronic shortness of breath, patient feels slightly better.  His appointment was scheduled In August and patient requests to move appt earlier.   Review of Systems  Constitutional:  Negative for appetite change, chills, fatigue, fever and unexpected weight change.  HENT:   Negative for hearing loss and voice change.   Eyes:  Negative for eye problems and icterus.  Respiratory:  Positive for shortness of breath. Negative for chest tightness and cough.   Cardiovascular:  Negative for chest pain and leg swelling.  Gastrointestinal:  Negative for abdominal distention and abdominal pain.  Endocrine: Negative for hot flashes.  Genitourinary:  Negative for difficulty urinating, dysuria and frequency.   Musculoskeletal:  Positive for gait problem.  Negative for arthralgias.  Skin:  Negative for itching and rash.  Neurological:  Positive for gait problem. Negative for light-headedness and numbness.  Hematological:  Negative for adenopathy. Does not bruise/bleed easily.  Psychiatric/Behavioral:  Negative for confusion.      MEDICAL HISTORY:  Past Medical History:  Diagnosis Date   Diabetes mellitus ([place])    HLD (hyperlipidemia)    HTN (hypertension)    Pulmonary embolism ([place])     SURGICAL HISTORY: History reviewed. No pertinent surgical history.  SOCIAL HISTORY: Social History   Socioeconomic History   Marital status: Married    Spouse name: Not on file   Number of children: Not on file   Years of education: Not on file   Highest education level: Not on file  Occupational History   Not on file  Tobacco Use   Smoking status: Never   Smokeless tobacco: Never  Substance and Sexual Activity   Alcohol use: Not on file   Drug use: Not on file   Sexual activity: Not on file  Other Topics Concern   Not on file  Social History Narrative   Not on file   Social Determinants of Health   Financial Resource Strain: Not on file  Food Insecurity: Not on file  Transportation Needs: Not on file  Physical Activity: Not on file  Stress: Not on file  Social Connections: Not on file  Intimate Partner Violence: Not on file    FAMILY HISTORY: Family History  Problem Relation Age of Onset   Kidney cancer Mother    Colon cancer Father    Diabetes Sister    Breast cancer Sister    Diabetes Brother    Diabetes Brother    Diabetes Brother    Lung cancer Brother    Diabetes Maternal Grandmother     ALLERGIES:  is allergic to lisinopril and metoprolol.  MEDICATIONS:  Current Outpatient Medications  Medication Sig Dispense Refill   apixaban (ELIQUIS) 5 MG TABS tablet Take 1 tablet (5 mg total) by mouth 2 (two) times daily. 180 tablet 0   atorvastatin (LIPITOR) 20 MG tablet Take 20 mg by mouth daily.     calcium-vitamin D (OSCAL WITH D) 500-200 MG-UNIT tablet Take 1 tablet by mouth.     Insulin Glargine (BASAGLAR KWIKPEN) 100 UNIT/ML Inject 32 Units into the skin daily.     ivabradine (CORLANOR) 7.5 MG TABS tablet Take '15mg'$  tablets TWO hours prior to cardiac CT scan. (Patient not taking:  Reported on 03/13/2021) 2 tablet 0   losartan (COZAAR) 25 MG tablet Take 25 mg by mouth daily.     metFORMIN (GLUCOPHAGE-XR) 500 MG 24 hr tablet Take 2,000 mg by mouth at bedtime.     vitamin B-12 (CYANOCOBALAMIN) 500 MCG tablet Take 500 mcg by mouth daily.     aspirin EC 81 MG tablet Take 81 mg by mouth daily. Swallow whole. (Patient not taking: Reported on 04/16/2021)     furosemide (LASIX) 20 MG tablet Take 20 mg by mouth daily. (Patient not taking: Reported on 03/13/2021)     metoprolol succinate (TOPROL-XL) 25 MG 24 hr tablet Take 12.5 mg by mouth daily. (Patient not taking: Reported on 04/16/2021)     pantoprazole (PROTONIX) 40 MG tablet Take 40 mg by mouth daily. (Patient not taking: Reported on 04/03/2021)     No current facility-administered medications for this visit.     PHYSICAL EXAMINATION: ECOG PERFORMANCE STATUS: 1 - Symptomatic but completely ambulatory Vitals:   07/07/21 1359  BP: (!) 159/90  Pulse: 65  Temp: (!) 96.4 F (35.8 C)   Filed Weights   07/07/21 1359  Weight: 203 lb (92.1 kg)    Physical Exam Constitutional:      General: He is not in acute distress. HENT:     Head: Normocephalic and atraumatic.  Eyes:     General: No scleral icterus.    Comments: Right eye ptosis.   Cardiovascular:     Rate and Rhythm: Normal rate and regular rhythm.     Heart sounds: Normal heart sounds.  Pulmonary:     Effort: Pulmonary effort is normal. No respiratory distress.     Breath sounds: No wheezing.  Abdominal:     General: Bowel sounds are normal. There is no distension.     Palpations: Abdomen is soft.  Musculoskeletal:        General: No deformity. Normal range of motion.     Cervical back: Normal range of motion and neck supple.  Skin:    General: Skin is warm and dry.     Findings: No erythema or rash.  Neurological:     Mental Status: He is alert and oriented to person, place, and time. Mental status is at baseline.     Cranial Nerves: No cranial nerve  deficit.     Coordination: Coordination normal.  Psychiatric:        Mood and Affect: Mood normal.     LABORATORY DATA:  I have reviewed the data as listed     Latest Ref Rng & Units 07/07/2021    1:47 PM 05/07/2021   11:25 AM 04/03/2021    4:06 PM  CBC  WBC 4.0 - 10.5 K/uL 4.8  4.5  5.4   Hemoglobin 13.0 - 17.0 g/dL 12.7  13.4  13.4   Hematocrit 39.0 - 52.0 % 38.9  40.7  40.5   Platelets 150 - 400 K/uL 235  261  249       Latest Ref Rng & Units 07/07/2021    1:47 PM 05/07/2021   11:25 AM 04/03/2021    4:06 PM  CMP  Glucose 70 - 99 mg/dL 90  97  111   BUN 8 - 23 mg/dL '18  17  20   '$ Creatinine 0.61 - 1.24 mg/dL 1.25  1.16  1.14   Sodium 135 - 145 mmol/L 137  134  138   Potassium 3.5 - 5.1 mmol/L 4.5  4.8  4.3   Chloride 98 - 111 mmol/L 104  101  104   CO2 22 - 32 mmol/L '27  26  26   '$ Calcium 8.9 - 10.3 mg/dL 9.3  9.9  9.7   Total Protein 6.5 - 8.1 g/dL 7.1  7.6  7.3   Total Bilirubin 0.3 - 1.2 mg/dL 1.0  1.1  0.3   Alkaline Phos 38 - 126 U/L 41  43  49   AST 15 - 41 U/L '17  19  19   '$ ALT 0 - 44 U/L '15  16  18       '$ RADIOGRAPHIC STUDIES: I have personally reviewed the radiological images as listed and agreed with the findings in the report. CT Angio Chest Pulmonary Embolism (PE) W or WO Contrast  Result Date: 06/12/2021 CLINICAL DATA:  Pulmonary embolism, previously identified on CT coronary angiogram 03/13/2021 EXAM: CT ANGIOGRAPHY CHEST WITH CONTRAST TECHNIQUE: Multidetector CT imaging of the chest was performed using the standard protocol during bolus administration of intravenous contrast. Multiplanar CT image  reconstructions and MIPs were obtained to evaluate the vascular anatomy. RADIATION DOSE REDUCTION: This exam was performed according to the departmental dose-optimization program which includes automated exposure control, adjustment of the mA and/or kV according to patient size and/or use of iterative reconstruction technique. CONTRAST:  7m OMNIPAQUE IOHEXOL 350 MG/ML SOLN  COMPARISON:  CT coronary angiogram 03/13/2021 FINDINGS: Cardiovascular: Satisfactory opacification of the pulmonary arteries to the segmental level. Previously noted saddle and bilateral lobar pulmonary embolus is almost completely resolved, with minimal residual nonocclusive subsegmental embolus present in the left lower lobe (series 5, image 210). Cardiomegaly. Left coronary artery calcifications. No pericardial effusion. Aortic atherosclerosis. Mediastinum/Nodes: No enlarged mediastinal, hilar, or axillary lymph nodes. Thyroid gland, trachea, and esophagus demonstrate no significant findings. Lungs/Pleura: Mild pulmonary fibrosis in a pattern with apical to basal gradient featuring irregular peripheral interstitial opacity, ground-glass, and some evidence of subpleural bronchiolectasis at the lung bases. No clear evidence of honeycombing. No pleural effusion or pneumothorax. Upper Abdomen: No acute abnormality. Musculoskeletal: No chest wall abnormality. No acute osseous findings. Disc degenerative disease and ankylosis of the thoracic spine. Review of the MIP images confirms the above findings. IMPRESSION: 1. Previously noted saddle and bilateral lobar pulmonary embolus is almost completely resolved, with minimal residual nonocclusive subsegmental embolus present in the left lower lobe. 2. Mild pulmonary fibrosis in a pattern with apical to basal gradient featuring irregular peripheral interstitial opacity, ground-glass, and some evidence of subpleural bronchiolectasis at the lung bases. No clear evidence of honeycombing. Findings are categorized as probable UIP per consensus guidelines: Diagnosis of Idiopathic Pulmonary Fibrosis: An Official ATS/ERS/JRS/ALAT Clinical Practice Guideline. [place] Iss 5, [phone] Sep 05 2016. 3. Cardiomegaly and coronary artery disease. Aortic Atherosclerosis (ICD10-I70.0). Electronically Signed   By: ADelanna AhmadiM.D.   On: 06/12/2021 09:07

## 2021-07-07 NOTE — Assessment & Plan Note (Signed)
Follow up with pulmonology

## 2021-08-25 ENCOUNTER — Ambulatory Visit: Payer: Medicare Other | Admitting: Oncology

## 2021-08-25 ENCOUNTER — Other Ambulatory Visit: Payer: Medicare Other

## 2021-09-17 ENCOUNTER — Other Ambulatory Visit: Payer: Medicare Other

## 2021-09-17 ENCOUNTER — Ambulatory Visit: Payer: Medicare Other | Admitting: Oncology

## 2021-09-22 ENCOUNTER — Encounter: Payer: Self-pay | Admitting: Oncology

## 2021-09-22 ENCOUNTER — Inpatient Hospital Stay: Payer: Medicare Other

## 2021-09-22 ENCOUNTER — Inpatient Hospital Stay: Payer: Medicare Other | Attending: Oncology | Admitting: Oncology

## 2021-09-22 VITALS — BP 155/96 | HR 88 | Temp 96.0°F | Resp 18 | Wt 198.3 lb

## 2021-09-22 DIAGNOSIS — Z7901 Long term (current) use of anticoagulants: Secondary | ICD-10-CM | POA: Diagnosis not present

## 2021-09-22 DIAGNOSIS — I2609 Other pulmonary embolism with acute cor pulmonale: Secondary | ICD-10-CM

## 2021-09-22 DIAGNOSIS — J8410 Pulmonary fibrosis, unspecified: Secondary | ICD-10-CM | POA: Diagnosis not present

## 2021-09-22 DIAGNOSIS — D721 Eosinophilia, unspecified: Secondary | ICD-10-CM | POA: Diagnosis not present

## 2021-09-22 DIAGNOSIS — Z801 Family history of malignant neoplasm of trachea, bronchus and lung: Secondary | ICD-10-CM | POA: Diagnosis not present

## 2021-09-22 DIAGNOSIS — Z803 Family history of malignant neoplasm of breast: Secondary | ICD-10-CM | POA: Insufficient documentation

## 2021-09-22 DIAGNOSIS — Z8051 Family history of malignant neoplasm of kidney: Secondary | ICD-10-CM | POA: Insufficient documentation

## 2021-09-22 DIAGNOSIS — Z86711 Personal history of pulmonary embolism: Secondary | ICD-10-CM | POA: Insufficient documentation

## 2021-09-22 DIAGNOSIS — R7989 Other specified abnormal findings of blood chemistry: Secondary | ICD-10-CM | POA: Diagnosis not present

## 2021-09-22 LAB — COMPREHENSIVE METABOLIC PANEL
ALT: 17 U/L (ref 0–44)
AST: 17 U/L (ref 15–41)
Albumin: 4.2 g/dL (ref 3.5–5.0)
Alkaline Phosphatase: 38 U/L (ref 38–126)
Anion gap: 6 (ref 5–15)
BUN: 17 mg/dL (ref 8–23)
CO2: 28 mmol/L (ref 22–32)
Calcium: 9.7 mg/dL (ref 8.9–10.3)
Chloride: 105 mmol/L (ref 98–111)
Creatinine, Ser: 1.38 mg/dL — ABNORMAL HIGH (ref 0.61–1.24)
GFR, Estimated: 51 mL/min — ABNORMAL LOW (ref >60)
Glucose, Bld: 106 mg/dL — ABNORMAL HIGH (ref 70–99)
Potassium: 5 mmol/L (ref 3.5–5.1)
Sodium: 139 mmol/L (ref 135–145)
Total Bilirubin: 0.7 mg/dL (ref 0.3–1.2)
Total Protein: 7.4 g/dL (ref 6.5–8.1)

## 2021-09-22 LAB — CBC WITH DIFFERENTIAL/PLATELET
Abs Immature Granulocytes: 0.01 10*3/uL (ref 0.00–0.07)
Basophils Absolute: 0 10*3/uL (ref 0.0–0.1)
Basophils Relative: 1 %
Eosinophils Absolute: 0.6 10*3/uL — ABNORMAL HIGH (ref 0.0–0.5)
Eosinophils Relative: 13 %
HCT: 40.8 % (ref 39.0–52.0)
Hemoglobin: 13.3 g/dL (ref 13.0–17.0)
Immature Granulocytes: 0 %
Lymphocytes Relative: 31 %
Lymphs Abs: 1.4 10*3/uL (ref 0.7–4.0)
MCH: 30.8 pg (ref 26.0–34.0)
MCHC: 32.6 g/dL (ref 30.0–36.0)
MCV: 94.4 fL (ref 80.0–100.0)
Monocytes Absolute: 0.3 10*3/uL (ref 0.1–1.0)
Monocytes Relative: 7 %
Neutro Abs: 2.1 10*3/uL (ref 1.7–7.7)
Neutrophils Relative %: 48 %
Platelets: 233 10*3/uL (ref 150–400)
RBC: 4.32 MIL/uL (ref 4.22–5.81)
RDW: 13.2 % (ref 11.5–15.5)
WBC: 4.4 10*3/uL (ref 4.0–10.5)
nRBC: 0 % (ref 0.0–0.2)

## 2021-09-22 MED ORDER — APIXABAN 2.5 MG PO TABS: 2.5000 mg | ORAL_TABLET | Freq: Two times a day (BID) | ORAL | 5 refills | Status: DC

## 2021-09-22 NOTE — Assessment & Plan Note (Signed)
Encourage oral hydration.  Check SPEP

## 2021-09-22 NOTE — Progress Notes (Signed)
Pt here for follow up. No new concerns voiced.   

## 2021-09-22 NOTE — Assessment & Plan Note (Signed)
Follow up with pulmonology

## 2021-09-22 NOTE — Progress Notes (Addendum)
Hematology/Oncology Progress note [phone] [phone]            Patient Care Team: Leonel Ramsay, MD as PCP - General (Infectious Diseases) Earlie Server, MD as Consulting Physician (Oncology)   ASSESSMENT & PLAN:   Pulmonary embolism [facility]) #History of bilateral submassive pulmonary embolism Clinically he is doing well and has finished 6 months of anticoagulation.  CT in June 2023 showed resolution of pulmonary embolism.  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 Monitor.    Elevated serum creatinine Encourage oral hydration.  Check SPEP  Pulmonary fibrosis ([place]) Follow up with pulmonology  Orders Placed This Encounter  Procedures   CBC with Differential/Platelet    Standing Status:   Future    Standing Expiration Date:   09/23/2022   Comprehensive metabolic panel    Standing Status:   Future    Standing Expiration Date:   09/23/2022   Protein electrophoresis, serum    Standing Status:   Future    Standing Expiration Date:   09/23/2022   Follow up in 6 months.  All questions were answered. The patient knows to call the clinic with any problems, questions or concerns.  Earlie Server, MD, PhD [facility] Hematology Oncology 09/22/2021    HISTORY OF PRESENTING ILLNESS:   Charles Townsend is a  81 y.o.  male with PMH listed below was seen in consultation at the request of  Leonel Ramsay, MD  for evaluation of pulmonary embolism 03/13/2021 - 03/15/2021 patient was recently admitted due to acute pulmonary embolism.  Patient has been feeling shortness of breath for the past 6 months.  Shortness of breath is progressively worsened. He also had 5-hour car ride from [facility] prior.  However per patient, shortness of breath has been an ongoing issue for him even prior to the trip.  Patient has sedentary lifestyle. Patient had  outpatient CT coronary study done on 03/13/2021 which showed incidental findings of large volume, bilateral pulmonary embolism with CT evidence of right heart strain-at least submassive PE.  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  Patient was asked to go to emergency room for further evaluation and treatments.  Patient was seen by vascular surgery and declined embolectomy, patient was treated with IV heparin and then transition to Eliquis prior to discharge.  Bilateral lower extremity ultrasounds were negative for DVT.  His echocardiogram during hospitalization showed LVEF of 55-60%.  Diastolic function was indeterminate.  Hypercoagulable work-up is negative.  Negative factor V Leiden mutation, negative prothrombin gene mutation.  Negative antiphospholipid syndrome antibody, negative beta-2 glycoprotein antibodies.  INTERVAL HISTORY Charles Townsend is a 81 y.o. male who has above history reviewed by me today presents for follow up visit for management of pulmonary embolism. Patient is on Eliquis 5 mg twice daily.  No bleeding events, chronic SOB improved, mild when he exerts himself.   Review of Systems  Constitutional:  Negative for appetite change, chills, fatigue, fever and unexpected weight change.  HENT:   Negative for hearing loss and voice change.   Eyes:  Negative for eye problems and icterus.  Respiratory:  Positive for shortness of breath. Negative for chest tightness and cough.   Cardiovascular:  Negative for chest pain and leg swelling.  Gastrointestinal:  Negative for abdominal distention and abdominal pain.  Endocrine: Negative for hot flashes.  Genitourinary:  Negative for difficulty urinating, dysuria and frequency.  Musculoskeletal:  Positive for gait problem. Negative for arthralgias.  Skin:  Negative for itching and rash.  Neurological:  Positive for gait problem. Negative for light-headedness and numbness.  Hematological:  Negative for adenopathy. Does not  bruise/bleed easily.  Psychiatric/Behavioral:  Negative for confusion.     MEDICAL HISTORY:  Past Medical History:  Diagnosis Date   Diabetes mellitus ([place])    HLD (hyperlipidemia)    HTN (hypertension)    Pulmonary embolism ([place])     SURGICAL HISTORY: History reviewed. No pertinent surgical history.  SOCIAL HISTORY: Social History   Socioeconomic History   Marital status: Married    Spouse name: Not on file   Number of children: Not on file   Years of education: Not on file   Highest education level: Not on file  Occupational History   Not on file  Tobacco Use   Smoking status: Never   Smokeless tobacco: Never  Substance and Sexual Activity   Alcohol use: Not on file   Drug use: Not on file   Sexual activity: Not on file  Other Topics Concern   Not on file  Social History Narrative   Not on file   Social Determinants of Health   Financial Resource Strain: Not on file  Food Insecurity: Not on file  Transportation Needs: Not on file  Physical Activity: Not on file  Stress: Not on file  Social Connections: Not on file  Intimate Partner Violence: Not on file    FAMILY HISTORY: Family History  Problem Relation Age of Onset   Kidney cancer Mother    Colon cancer Father    Diabetes Sister    Breast cancer Sister    Diabetes Brother    Diabetes Brother    Diabetes Brother    Lung cancer Brother    Diabetes Maternal Grandmother     ALLERGIES:  is allergic to lisinopril and metoprolol.  MEDICATIONS:  Current Outpatient Medications  Medication Sig Dispense Refill   apixaban (ELIQUIS) 2.5 MG TABS tablet Take 1 tablet (2.5 mg total) by mouth 2 (two) times daily. 60 tablet 5   atorvastatin (LIPITOR) 20 MG tablet Take 20 mg by mouth daily.     calcium-vitamin D (OSCAL WITH D) 500-200 MG-UNIT tablet Take 1 tablet by mouth.     Insulin Glargine (BASAGLAR KWIKPEN) 100 UNIT/ML Inject 22 Units into the skin daily.     losartan (COZAAR) 25 MG tablet Take 25 mg by  mouth daily.     metFORMIN (GLUCOPHAGE-XR) 500 MG 24 hr tablet Take 2,000 mg by mouth at bedtime.     vitamin B-12 (CYANOCOBALAMIN) 500 MCG tablet Take 500 mcg by mouth daily.     No current facility-administered medications for this visit.     PHYSICAL EXAMINATION: ECOG PERFORMANCE STATUS: 1 - Symptomatic but completely ambulatory Vitals:   09/22/21 1036  BP: (!) 155/96  Pulse: 88  Resp: 18  Temp: (!) 96 F (35.6 C)   Filed Weights   09/22/21 1036  Weight: 198 lb 4.8 oz (89.9 kg)    Physical Exam Constitutional:      General: He is not in acute distress. HENT:     Head: Normocephalic and atraumatic.  Eyes:     General: No scleral icterus.    Comments: Right eye ptosis.   Cardiovascular:     Rate and Rhythm: Normal rate and regular rhythm.     Heart sounds: Normal heart sounds.  Pulmonary:     Effort: Pulmonary effort is  normal. No respiratory distress.     Breath sounds: No wheezing.  Abdominal:     General: Bowel sounds are normal. There is no distension.     Palpations: Abdomen is soft.  Musculoskeletal:        General: No deformity. Normal range of motion.     Cervical back: Normal range of motion and neck supple.  Skin:    General: Skin is warm and dry.     Findings: No erythema or rash.  Neurological:     Mental Status: He is alert and oriented to person, place, and time. Mental status is at baseline.     Cranial Nerves: No cranial nerve deficit.     Coordination: Coordination normal.  Psychiatric:        Mood and Affect: Mood normal.     LABORATORY DATA:  I have reviewed the data as listed     Latest Ref Rng & Units 09/22/2021   10:17 AM 07/07/2021    1:47 PM 05/07/2021   11:25 AM  CBC  WBC 4.0 - 10.5 K/uL 4.4  4.8  4.5   Hemoglobin 13.0 - 17.0 g/dL 13.3  12.7  13.4   Hematocrit 39.0 - 52.0 % 40.8  38.9  40.7   Platelets 150 - 400 K/uL 233  235  261       Latest Ref Rng & Units 09/22/2021   10:17 AM 07/07/2021    1:47 PM 05/07/2021   11:25 AM   CMP  Glucose 70 - 99 mg/dL 106  90  97   BUN 8 - 23 mg/dL '17  18  17   '$ Creatinine 0.61 - 1.24 mg/dL 1.38  1.25  1.16   Sodium 135 - 145 mmol/L 139  137  134   Potassium 3.5 - 5.1 mmol/L 5.0  4.5  4.8   Chloride 98 - 111 mmol/L 105  104  101   CO2 22 - 32 mmol/L '28  27  26   '$ Calcium 8.9 - 10.3 mg/dL 9.7  9.3  9.9   Total Protein 6.5 - 8.1 g/dL 7.4  7.1  7.6   Total Bilirubin 0.3 - 1.2 mg/dL 0.7  1.0  1.1   Alkaline Phos 38 - 126 U/L 38  41  43   AST 15 - 41 U/L '17  17  19   '$ ALT 0 - 44 U/L '17  15  16       '$ RADIOGRAPHIC STUDIES: I have personally reviewed the radiological images as listed and agreed with the findings in the report. No results found.

## 2021-09-22 NOTE — Assessment & Plan Note (Addendum)
#  History of bilateral submassive pulmonary embolism Clinically he is doing well and has finished 6 months of anticoagulation.  CT in June 2023 showed resolution of pulmonary embolism.  Recommend to decrease Eliquis to 2.5 mg twice daily for long term anticoagulation maintenance

## 2021-09-22 NOTE — Assessment & Plan Note (Signed)
Chronic,mild absolute eosinophilia, likely reactive. Monitor.

## 2021-11-03 ENCOUNTER — Encounter (INDEPENDENT_AMBULATORY_CARE_PROVIDER_SITE_OTHER): Payer: Self-pay

## 2022-01-13 ENCOUNTER — Other Ambulatory Visit: Payer: Self-pay | Admitting: Internal Medicine

## 2022-01-13 DIAGNOSIS — I2699 Other pulmonary embolism without acute cor pulmonale: Secondary | ICD-10-CM

## 2022-01-16 ENCOUNTER — Ambulatory Visit
Admission: RE | Admit: 2022-01-16 | Discharge: 2022-01-16 | Disposition: A | Discharge status: Home / Self Care | Payer: Medicare Other | Source: Ambulatory Visit | Attending: Internal Medicine | Admitting: Internal Medicine

## 2022-01-16 DIAGNOSIS — I2699 Other pulmonary embolism without acute cor pulmonale: Secondary | ICD-10-CM | POA: Diagnosis not present

## 2022-01-16 MED ORDER — IOHEXOL 350 MG/ML SOLN
75.0000 mL | Freq: Once | INTRAVENOUS | Status: AC | PRN
  Administered 2022-01-16: 75 mL via INTRAVENOUS

## 2022-01-29 ENCOUNTER — Encounter: Admission: RE | Payer: Self-pay | Source: Ambulatory Visit

## 2022-01-29 ENCOUNTER — Ambulatory Visit: Admission: RE | Admit: 2022-01-29 | Payer: Medicare Other | Source: Ambulatory Visit | Admitting: Cardiology

## 2022-01-29 DIAGNOSIS — Z45010 Encounter for checking and testing of cardiac pacemaker pulse generator [battery]: Secondary | ICD-10-CM

## 2022-01-29 SURGERY — PPM GENERATOR CHANGEOUT
Anesthesia: Moderate Sedation

## 2022-02-12 ENCOUNTER — Encounter: Payer: Self-pay | Admitting: Oncology

## 2022-02-12 ENCOUNTER — Inpatient Hospital Stay: Payer: Medicare Other | Attending: Oncology | Admitting: Oncology

## 2022-02-12 ENCOUNTER — Inpatient Hospital Stay: Payer: Medicare Other

## 2022-02-12 ENCOUNTER — Other Ambulatory Visit: Payer: Medicare Other

## 2022-02-12 VITALS — BP 142/80 | HR 88 | Temp 96.1°F | Resp 18 | Wt 201.1 lb

## 2022-02-12 DIAGNOSIS — Z800 Family history of malignant neoplasm of digestive organs: Secondary | ICD-10-CM | POA: Diagnosis not present

## 2022-02-12 DIAGNOSIS — D721 Eosinophilia, unspecified: Secondary | ICD-10-CM | POA: Diagnosis not present

## 2022-02-12 DIAGNOSIS — Z8051 Family history of malignant neoplasm of kidney: Secondary | ICD-10-CM | POA: Insufficient documentation

## 2022-02-12 DIAGNOSIS — J8410 Pulmonary fibrosis, unspecified: Secondary | ICD-10-CM | POA: Diagnosis not present

## 2022-02-12 DIAGNOSIS — Z86711 Personal history of pulmonary embolism: Secondary | ICD-10-CM | POA: Insufficient documentation

## 2022-02-12 DIAGNOSIS — R0602 Shortness of breath: Secondary | ICD-10-CM | POA: Insufficient documentation

## 2022-02-12 DIAGNOSIS — Z7901 Long term (current) use of anticoagulants: Secondary | ICD-10-CM | POA: Diagnosis not present

## 2022-02-12 DIAGNOSIS — I2609 Other pulmonary embolism with acute cor pulmonale: Secondary | ICD-10-CM

## 2022-02-12 DIAGNOSIS — R7989 Other specified abnormal findings of blood chemistry: Secondary | ICD-10-CM

## 2022-02-12 DIAGNOSIS — Z803 Family history of malignant neoplasm of breast: Secondary | ICD-10-CM | POA: Insufficient documentation

## 2022-02-12 DIAGNOSIS — Z801 Family history of malignant neoplasm of trachea, bronchus and lung: Secondary | ICD-10-CM | POA: Insufficient documentation

## 2022-02-12 DIAGNOSIS — D472 Monoclonal gammopathy: Secondary | ICD-10-CM | POA: Diagnosis not present

## 2022-02-12 LAB — CBC WITH DIFFERENTIAL/PLATELET
Abs Immature Granulocytes: 0.01 10*3/uL (ref 0.00–0.07)
Basophils Absolute: 0 10*3/uL (ref 0.0–0.1)
Basophils Relative: 1 %
Eosinophils Absolute: 0.6 10*3/uL — ABNORMAL HIGH (ref 0.0–0.5)
Eosinophils Relative: 15 %
HCT: 40 % (ref 39.0–52.0)
Hemoglobin: 13.1 g/dL (ref 13.0–17.0)
Immature Granulocytes: 0 %
Lymphocytes Relative: 25 %
Lymphs Abs: 1 10*3/uL (ref 0.7–4.0)
MCH: 30.9 pg (ref 26.0–34.0)
MCHC: 32.8 g/dL (ref 30.0–36.0)
MCV: 94.3 fL (ref 80.0–100.0)
Monocytes Absolute: 0.2 10*3/uL (ref 0.1–1.0)
Monocytes Relative: 6 %
Neutro Abs: 2.2 10*3/uL (ref 1.7–7.7)
Neutrophils Relative %: 53 %
Platelets: 234 10*3/uL (ref 150–400)
RBC: 4.24 MIL/uL (ref 4.22–5.81)
RDW: 13 % (ref 11.5–15.5)
WBC: 4 10*3/uL (ref 4.0–10.5)
nRBC: 0 % (ref 0.0–0.2)

## 2022-02-12 LAB — COMPREHENSIVE METABOLIC PANEL
ALT: 17 U/L (ref 0–44)
AST: 19 U/L (ref 15–41)
Albumin: 4.4 g/dL (ref 3.5–5.0)
Alkaline Phosphatase: 42 U/L (ref 38–126)
Anion gap: 8 (ref 5–15)
BUN: 17 mg/dL (ref 8–23)
CO2: 27 mmol/L (ref 22–32)
Calcium: 9.7 mg/dL (ref 8.9–10.3)
Chloride: 105 mmol/L (ref 98–111)
Creatinine, Ser: 1.26 mg/dL — ABNORMAL HIGH (ref 0.61–1.24)
GFR, Estimated: 57 mL/min — ABNORMAL LOW (ref >60)
Glucose, Bld: 82 mg/dL (ref 70–99)
Potassium: 4.5 mmol/L (ref 3.5–5.1)
Sodium: 140 mmol/L (ref 135–145)
Total Bilirubin: 1 mg/dL (ref 0.3–1.2)
Total Protein: 7.5 g/dL (ref 6.5–8.1)

## 2022-02-12 LAB — LACTATE DEHYDROGENASE: LDH: 132 U/L (ref 98–192)

## 2022-02-12 LAB — VITAMIN B12: Vitamin B-12: 736 pg/mL (ref 180–914)

## 2022-02-12 MED ORDER — APIXABAN 2.5 MG PO TABS: 2.5000 mg | ORAL_TABLET | Freq: Two times a day (BID) | ORAL | 5 refills | Status: DC

## 2022-02-12 NOTE — Patient Instructions (Signed)
Skeletal survey (bone survey) is a walk- in service and it may be done at [place].

## 2022-02-12 NOTE — Assessment & Plan Note (Signed)
Chronic,mild absolute eosinophilia, likely reactive. Stable.  Continue observation.

## 2022-02-12 NOTE — Assessment & Plan Note (Addendum)
Check CBC, CMP, multiple myeloma panel, light chain ratio and 24-hour urine protein after pheresis. Check skeletal survey.   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 Less frequently, these patients progress to AL amyloidosis, light chain deposition disease, or another lymphoproliferative disorder.  For now I recommend observation. Charles Townsend

## 2022-02-12 NOTE — Assessment & Plan Note (Signed)
Follow up with pulmonology

## 2022-02-12 NOTE — Progress Notes (Signed)
Hematology/Oncology Progress note [phone] [phone]            Patient Care Team: Leonel Ramsay, MD as PCP - General (Infectious Diseases) Earlie Server, MD as Consulting Physician (Oncology)   ASSESSMENT & PLAN:   Pulmonary embolism [facility]) #History of bilateral submassive pulmonary embolism Clinically he is doing well and has finished 6 months of anticoagulation.  CT in June 2023 showed resolution of pulmonary embolism.  Continue Eliquis to 2.5 mg twice daily for long term anticoagulation maintenance    MGUS (monoclonal gammopathy of unknown significance) Check CBC, CMP, multiple myeloma panel, light chain ratio and 24-hour urine protein after pheresis. Check skeletal survey.   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 Less frequently, these patients progress to AL amyloidosis, light chain deposition disease, or another lymphoproliferative disorder.  For now I recommend observation. 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 Stable.  Continue observation.    Pulmonary fibrosis ([place]) Follow up with pulmonology  Orders Placed This Encounter  Procedures   DG Bone Survey Met    Standing Status:   Future    Standing Expiration Date:   02/13/2023    Order Specific Question:   Reason for Exam (SYMPTOM  OR DIAGNOSIS REQUIRED)    Answer:   MGUS    Order Specific Question:   Preferred imaging location?    Answer:   [facility]   Multiple Myeloma Panel (SPEP&IFE w/QIG)    Standing Status:   Future    Number of Occurrences:   1    Standing Expiration Date:   02/13/2023   Kappa/lambda light chains    Standing Status:   Future    Number of Occurrences:   1    Standing Expiration Date:   02/13/2023   IFE+PROTEIN ELECTRO, 24-HR UR    Standing Status:   Future     Standing Expiration Date:   02/13/2023   CBC with Differential/Platelet    Standing Status:   Future    Number of Occurrences:   1    Standing Expiration Date:   02/13/2023   Comprehensive metabolic panel    Standing Status:   Future    Number of Occurrences:   1    Standing Expiration Date:   02/13/2023   Lactate dehydrogenase    Standing Status:   Future    Number of Occurrences:   1    Standing Expiration Date:   02/13/2023   Beta 2 microglobulin, serum    Standing Status:   Future    Number of Occurrences:   1    Standing Expiration Date:   02/13/2023   Follow up in 6 months.  All questions were answered. The patient knows to call the clinic with any problems, questions or concerns.  Earlie Server, MD, PhD [facility] Hematology Oncology 02/12/2022    HISTORY OF PRESENTING ILLNESS:   Charles Townsend is a  82 y.o.  male with PMH listed below was seen in consultation at the request of  Leonel Ramsay, MD  for evaluation of pulmonary embolism 03/13/2021 - 03/15/2021 patient was recently admitted due to acute pulmonary embolism.  Patient has been feeling shortness of breath for the past 6 months.  Shortness of breath is progressively worsened. He also had 5-hour car ride from [facility] prior.  However per  patient, shortness of breath has been an ongoing issue for him even prior to the trip.  Patient has sedentary lifestyle. Patient had outpatient CT coronary study done on 03/13/2021 which showed incidental findings of large volume, bilateral pulmonary embolism with CT evidence of right heart strain-at least submassive PE.  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  Patient was asked to go to emergency room for further evaluation and treatments.  Patient was seen by vascular surgery and declined embolectomy, patient was treated with IV heparin and then transition to Eliquis prior to discharge.  Bilateral lower extremity ultrasounds were negative for DVT.  His echocardiogram during  hospitalization showed LVEF of 55-60%.  Diastolic function was indeterminate.  Hypercoagulable work-up is negative.  Negative factor V Leiden mutation, negative prothrombin gene mutation.  Negative antiphospholipid syndrome antibody, negative beta-2 glycoprotein antibodies.  INTERVAL HISTORY Kizer Nobbe is a 82 y.o. male who has above history reviewed by me today presents for follow up visit for management of pulmonary embolism. Patient is on Eliquis 2.5 mg twice daily.  No bleeding events, chronic SOB improved Recent blood work showed M protein 0.4 on SPEP, IFE showed IgA monoclonal   Review of Systems  Constitutional:  Negative for appetite change, chills, fatigue, fever and unexpected weight change.  HENT:   Negative for hearing loss and voice change.   Eyes:  Negative for eye problems and icterus.  Respiratory:  Positive for shortness of breath. Negative for chest tightness and cough.   Cardiovascular:  Negative for chest pain and leg swelling.  Gastrointestinal:  Negative for abdominal distention and abdominal pain.  Endocrine: Negative for hot flashes.  Genitourinary:  Negative for difficulty urinating, dysuria and frequency.   Musculoskeletal:  Positive for gait problem. Negative for arthralgias.  Skin:  Negative for itching and rash.  Neurological:  Positive for gait problem. Negative for light-headedness and numbness.  Hematological:  Negative for adenopathy. Does not bruise/bleed easily.  Psychiatric/Behavioral:  Negative for confusion.     MEDICAL HISTORY:  Past Medical History:  Diagnosis Date   Diabetes mellitus ([place])    HLD (hyperlipidemia)    HTN (hypertension)    Pulmonary embolism ([place])     SURGICAL HISTORY: History reviewed. No pertinent surgical history.  SOCIAL HISTORY: Social History   Socioeconomic History   Marital status: Married    Spouse name: Not on file   Number of children: Not on file   Years of education: Not on file   Highest education  level: Not on file  Occupational History   Not on file  Tobacco Use   Smoking status: Never   Smokeless tobacco: Never  Substance and Sexual Activity   Alcohol use: Not on file   Drug use: Not on file   Sexual activity: Not on file  Other Topics Concern   Not on file  Social History Narrative   Not on file   Social Determinants of Health   Financial Resource Strain: Not on file  Food Insecurity: Not on file  Transportation Needs: Not on file  Physical Activity: Not on file  Stress: Not on file  Social Connections: Not on file  Intimate Partner Violence: Not on file    FAMILY HISTORY: Family History  Problem Relation Age of Onset   Kidney cancer Mother    Colon cancer Father    Diabetes Sister    Breast cancer Sister    Diabetes Brother    Diabetes Brother    Diabetes Brother  Lung cancer Brother    Diabetes Maternal Grandmother     ALLERGIES:  is allergic to lisinopril and metoprolol.  MEDICATIONS:  Current Outpatient Medications  Medication Sig Dispense Refill   atorvastatin (LIPITOR) 20 MG tablet Take 20 mg by mouth daily.     calcium-vitamin D (OSCAL WITH D) 500-200 MG-UNIT tablet Take 1 tablet by mouth.     Insulin Glargine (BASAGLAR KWIKPEN) 100 UNIT/ML Inject 22 Units into the skin daily.     losartan (COZAAR) 25 MG tablet Take 25 mg by mouth daily.     metFORMIN (GLUCOPHAGE-XR) 500 MG 24 hr tablet Take 2,000 mg by mouth at bedtime.     vitamin B-12 (CYANOCOBALAMIN) 500 MCG tablet Take 500 mcg by mouth daily.     apixaban (ELIQUIS) 2.5 MG TABS tablet Take 1 tablet (2.5 mg total) by mouth 2 (two) times daily. 60 tablet 5   No current facility-administered medications for this visit.     PHYSICAL EXAMINATION: ECOG PERFORMANCE STATUS: 1 - Symptomatic but completely ambulatory Vitals:   02/12/22 1047  BP: (!) 142/80  Pulse: 88  Resp: 18  Temp: (!) 96.1 F (35.6 C)   Filed Weights   02/12/22 1047  Weight: 201 lb 1.6 oz (91.2 kg)    Physical  Exam Constitutional:      General: He is not in acute distress. HENT:     Head: Normocephalic and atraumatic.  Eyes:     General: No scleral icterus.    Comments: Right eye ptosis.   Cardiovascular:     Rate and Rhythm: Normal rate and regular rhythm.     Heart sounds: Normal heart sounds.  Pulmonary:     Effort: Pulmonary effort is normal. No respiratory distress.     Breath sounds: No wheezing.  Abdominal:     General: Bowel sounds are normal. There is no distension.     Palpations: Abdomen is soft.  Musculoskeletal:        General: No deformity. Normal range of motion.     Cervical back: Normal range of motion and neck supple.  Skin:    General: Skin is warm and dry.     Findings: No erythema or rash.  Neurological:     Mental Status: He is alert and oriented to person, place, and time. Mental status is at baseline.     Cranial Nerves: No cranial nerve deficit.     Coordination: Coordination normal.  Psychiatric:        Mood and Affect: Mood normal.     LABORATORY DATA:  I have reviewed the data as listed     Latest Ref Rng & Units 02/12/2022   11:32 AM 09/22/2021   10:17 AM 07/07/2021    1:47 PM  CBC  WBC 4.0 - 10.5 K/uL 4.0  4.4  4.8   Hemoglobin 13.0 - 17.0 g/dL 13.1  13.3  12.7   Hematocrit 39.0 - 52.0 % 40.0  40.8  38.9   Platelets 150 - 400 K/uL 234  233  235       Latest Ref Rng & Units 02/12/2022   11:32 AM 09/22/2021   10:17 AM 07/07/2021    1:47 PM  CMP  Glucose 70 - 99 mg/dL 82  106  90   BUN 8 - 23 mg/dL '17  17  18   '$ Creatinine 0.61 - 1.24 mg/dL 1.26  1.38  1.25   Sodium 135 - 145 mmol/L 140  139  137   Potassium 3.5 -  5.1 mmol/L 4.5  5.0  4.5   Chloride 98 - 111 mmol/L 105  105  104   CO2 22 - 32 mmol/L '27  28  27   '$ Calcium 8.9 - 10.3 mg/dL 9.7  9.7  9.3   Total Protein 6.5 - 8.1 g/dL 7.5  7.4  7.1   Total Bilirubin 0.3 - 1.2 mg/dL 1.0  0.7  1.0   Alkaline Phos 38 - 126 U/L 42  38  41   AST 15 - 41 U/L '19  17  17   '$ ALT 0 - 44 U/L '17  17  15        '$ RADIOGRAPHIC STUDIES: I have personally reviewed the radiological images as listed and agreed with the findings in the report. CT Angio Chest Pulmonary Embolism (PE) W or WO Contrast  Result Date: 01/16/2022 CLINICAL DATA:  Follow-up pulmonary embolus EXAM: CT ANGIOGRAPHY CHEST WITH CONTRAST TECHNIQUE: Multidetector CT imaging of the chest was performed using the standard protocol during bolus administration of intravenous contrast. Multiplanar CT image reconstructions and MIPs were obtained to evaluate the vascular anatomy. RADIATION DOSE REDUCTION: This exam was performed according to the departmental dose-optimization program which includes automated exposure control, adjustment of the mA and/or kV according to patient size and/or use of iterative reconstruction technique. CONTRAST:  26m OMNIPAQUE IOHEXOL 350 MG/ML SOLN COMPARISON:  Chest CT dated January 17, 2012 FINDINGS: Cardiovascular: Linear filling defects of the distal right pulmonary artery and bilateral lower lobe pulmonary arteries. No evidence of acute pulmonary embolus. Normal heart size. No pericardial effusion. Normal caliber thoracic aorta with mild atherosclerotic disease. Moderate coronary artery calcifications. Mediastinum/Nodes: Esophagus and thyroid are unremarkable. No pathologically enlarged lymph nodes seen in the chest. Lungs/Pleura: Central airways are patent. Bilateral subpleural and lower lung predominant reticular opacities with associated traction bronchiectasis, not significantly changed when compared with prior exam. No consolidation, pleural effusion pneumothorax Upper Abdomen: No acute abnormality. Musculoskeletal: No chest wall abnormality. No acute or significant osseous findings. Review of the MIP images confirms the above findings. IMPRESSION: 1. No evidence of acute pulmonary embolus. 2. Linear filling defects of the distal right pulmonary artery and bilateral lower lobe pulmonary arteries, compatible with  chronic post thrombotic change. 3. Bilateral subpleural and lower lung predominant reticular opacities with associated traction bronchiectasis, not significantly changed when compared with prior exam and compatible with interstitial lung disease. Findings are categorized as probable UIP per consensus guidelines: Diagnosis of Idiopathic Pulmonary Fibrosis: An Official ATS/ERS/JRS/ALAT Clinical Practice Guideline. [place] Iss 5, [phone] Sep 05 2016. 4. Coronary artery calcifications and aortic Atherosclerosis (ICD10-I70.0). Electronically Signed   By: LYetta GlassmanM.D.   On: 01/16/2022 08:47

## 2022-02-12 NOTE — Assessment & Plan Note (Addendum)
#  History of bilateral submassive pulmonary embolism Clinically he is doing well and has finished 6 months of anticoagulation.  CT in June 2023 showed resolution of pulmonary embolism.  Continue Eliquis to 2.5 mg twice daily for long term anticoagulation maintenance

## 2022-02-13 LAB — KAPPA/LAMBDA LIGHT CHAINS
Kappa free light chain: 20 mg/L — ABNORMAL HIGH (ref 3.3–19.4)
Kappa, lambda light chain ratio: 1.12 (ref 0.26–1.65)
Lambda free light chains: 17.9 mg/L (ref 5.7–26.3)

## 2022-02-13 LAB — BETA 2 MICROGLOBULIN, SERUM: Beta-2 Microglobulin: 2 mg/L (ref 0.6–2.4)

## 2022-02-14 LAB — TRYPTASE: Tryptase: 8.4 ug/L (ref 2.2–13.2)

## 2022-02-16 LAB — PROTEIN ELECTROPHORESIS, SERUM
A/G Ratio: 1.4 (ref 0.7–1.7)
Albumin ELP: 3.8 g/dL (ref 2.9–4.4)
Alpha-1-Globulin: 0.2 g/dL (ref 0.0–0.4)
Alpha-2-Globulin: 0.6 g/dL (ref 0.4–1.0)
Beta Globulin: 0.8 g/dL (ref 0.7–1.3)
Gamma Globulin: 1.1 g/dL (ref 0.4–1.8)
Globulin, Total: 2.8 g/dL (ref 2.2–3.9)
M-Spike, %: 0.4 g/dL — ABNORMAL HIGH
Total Protein ELP: 6.6 g/dL (ref 6.0–8.5)

## 2022-02-18 ENCOUNTER — Ambulatory Visit
Admission: RE | Admit: 2022-02-18 | Discharge: 2022-02-18 | Disposition: A | Discharge status: Home / Self Care | Payer: Medicare Other | Source: Ambulatory Visit | Attending: Oncology | Admitting: Oncology

## 2022-02-18 ENCOUNTER — Other Ambulatory Visit: Payer: Self-pay | Admitting: Oncology

## 2022-02-18 ENCOUNTER — Ambulatory Visit
Admission: RE | Admit: 2022-02-18 | Discharge: 2022-02-18 | Disposition: A | Discharge status: Home / Self Care | Payer: Medicare Other | Attending: Oncology | Admitting: Oncology

## 2022-02-18 DIAGNOSIS — D472 Monoclonal gammopathy: Secondary | ICD-10-CM | POA: Insufficient documentation

## 2022-02-18 LAB — MULTIPLE MYELOMA PANEL, SERUM
Albumin SerPl Elph-Mcnc: 3.8 g/dL (ref 2.9–4.4)
Albumin/Glob SerPl: 1.4 (ref 0.7–1.7)
Alpha 1: 0.2 g/dL (ref 0.0–0.4)
Alpha2 Glob SerPl Elph-Mcnc: 0.6 g/dL (ref 0.4–1.0)
B-Globulin SerPl Elph-Mcnc: 0.8 g/dL (ref 0.7–1.3)
Gamma Glob SerPl Elph-Mcnc: 1.1 g/dL (ref 0.4–1.8)
Globulin, Total: 2.8 g/dL (ref 2.2–3.9)
IgA: 434 mg/dL (ref 61–437)
IgG (Immunoglobin G), Serum: 880 mg/dL (ref 603–1613)
IgM (Immunoglobulin M), Srm: 63 mg/dL (ref 15–143)
M Protein SerPl Elph-Mcnc: 0.3 g/dL — ABNORMAL HIGH
Total Protein ELP: 6.6 g/dL (ref 6.0–8.5)

## 2022-02-19 ENCOUNTER — Telehealth: Payer: Self-pay

## 2022-02-19 NOTE — Telephone Encounter (Signed)
Spoke to pt and he verbalized understanding.   Please schedule and inform pt of appt:  Labs in 6 months  MD 1 week after labs.

## 2022-02-19 NOTE — Telephone Encounter (Signed)
-----   Message from Earlie Server, MD sent at 02/18/2022 10:31 PM EST ----- Please let patient know that he has pretty multiple myeloma condition.  No intervention needed.  Observation.  Recommend follow-up in 6 months.  Labs 1 week prior to MD. labs are ordered.  Thank you

## 2022-02-20 ENCOUNTER — Other Ambulatory Visit: Payer: Self-pay

## 2022-02-20 DIAGNOSIS — D472 Monoclonal gammopathy: Secondary | ICD-10-CM

## 2022-02-20 DIAGNOSIS — Z86711 Personal history of pulmonary embolism: Secondary | ICD-10-CM | POA: Diagnosis not present

## 2022-02-20 DIAGNOSIS — I2609 Other pulmonary embolism with acute cor pulmonale: Secondary | ICD-10-CM

## 2022-02-23 LAB — IFE+PROTEIN ELECTRO, 24-HR UR
% BETA, Urine: 0 %
ALPHA 1 URINE: 0 %
Albumin, U: 100 %
Alpha 2, Urine: 0 %
GAMMA GLOBULIN URINE: 0 %
Total Protein, Urine-Ur/day: 145 mg/24 hr (ref 30–150)
Total Protein, Urine: 9.5 mg/dL
Total Volume: 1525

## 2022-02-26 ENCOUNTER — Other Ambulatory Visit: Payer: Self-pay | Admitting: Oncology

## 2022-02-26 ENCOUNTER — Telehealth: Payer: Self-pay

## 2022-02-26 DIAGNOSIS — M899 Disorder of bone, unspecified: Secondary | ICD-10-CM

## 2022-02-26 NOTE — Telephone Encounter (Signed)
Called and spoke to pt. Informed him of the need for MRI of forearm. He verbalized understanding.   Please schedule MRI Left forearm and inform pt of appt.

## 2022-02-26 NOTE — Telephone Encounter (Signed)
-----   Message from Earlie Server, MD sent at 02/26/2022 10:14 AM EST ----- Please let patient know that her bone survey showed a 2 cm bone lesion on her left forearm. I recommend left forearm MRI w wo contrast for further evaluation.  Please arrange if she agrees. thx

## 2022-02-27 ENCOUNTER — Telehealth: Payer: Self-pay | Admitting: Oncology

## 2022-02-27 ENCOUNTER — Other Ambulatory Visit: Payer: Self-pay

## 2022-02-27 NOTE — Telephone Encounter (Signed)
Wife requested a call back and call was returned. She wanted to know more about why MRI was needed. Reason was explained. Also confirmed with Pamala Hurry (wife) that pt has a pacemaker. Informed her that since pt has a pacemaker, appt may be moved to [place] and she verbalized understanding.   Please contact Pamala Hurry with any appt changes.

## 2022-02-27 NOTE — Telephone Encounter (Signed)
Pamala Hurry (wife) called my phone and wants a nurse or Dr Tasia Catchings to call back. The patient and wife have a question of the reason for the MRI on his Fore Arm.She said specifically what is Dr Tasia Catchings looking for? I sent an inbasket to the team.

## 2022-02-27 NOTE — Telephone Encounter (Signed)
LVM for pt spouse with req for information that is needed regarding pt's Pacemaker for MRI.

## 2022-03-02 ENCOUNTER — Other Ambulatory Visit: Payer: Self-pay

## 2022-03-02 DIAGNOSIS — M899 Disorder of bone, unspecified: Secondary | ICD-10-CM

## 2022-03-02 DIAGNOSIS — D472 Monoclonal gammopathy: Secondary | ICD-10-CM

## 2022-03-02 NOTE — Telephone Encounter (Signed)
Received message from Radiology statint that pacemaker is unsafe for MRI.   We will dc MRI order, pt will need PET scan. Called and left message for pt's wife, Charles Townsend, to let her know that we were switching to PET scan.   Please scheudle PET and inform pt/pt's wife of appt.

## 2022-03-06 ENCOUNTER — Ambulatory Visit: Payer: Medicare Other

## 2022-03-12 ENCOUNTER — Ambulatory Visit
Admission: RE | Admit: 2022-03-12 | Discharge: 2022-03-12 | Disposition: A | Discharge status: Home / Self Care | Payer: Medicare Other | Source: Ambulatory Visit | Attending: Oncology | Admitting: Oncology

## 2022-03-12 DIAGNOSIS — I700 Atherosclerosis of aorta: Secondary | ICD-10-CM | POA: Insufficient documentation

## 2022-03-12 DIAGNOSIS — R936 Abnormal findings on diagnostic imaging of limbs: Secondary | ICD-10-CM | POA: Diagnosis not present

## 2022-03-12 DIAGNOSIS — M899 Disorder of bone, unspecified: Secondary | ICD-10-CM | POA: Diagnosis present

## 2022-03-12 DIAGNOSIS — D472 Monoclonal gammopathy: Secondary | ICD-10-CM | POA: Diagnosis not present

## 2022-03-12 DIAGNOSIS — R937 Abnormal findings on diagnostic imaging of other parts of musculoskeletal system: Secondary | ICD-10-CM | POA: Diagnosis not present

## 2022-03-12 LAB — GLUCOSE, CAPILLARY: Glucose-Capillary: 100 mg/dL — ABNORMAL HIGH (ref 70–99)

## 2022-03-12 MED ORDER — FLUDEOXYGLUCOSE F - 18 (FDG) INJECTION
10.4000 | Freq: Once | INTRAVENOUS | Status: AC | PRN
  Administered 2022-03-12: 10.38 via INTRAVENOUS

## 2022-03-17 ENCOUNTER — Telehealth: Payer: Self-pay | Admitting: *Deleted

## 2022-03-17 ENCOUNTER — Telehealth: Payer: Self-pay | Admitting: Oncology

## 2022-03-17 NOTE — Telephone Encounter (Signed)
Wife called on behalf of patient and requested discussion of patient's recent image results. Daughter also joined the phone conversation.  I reviewed skeletal survey and PET scan results.  I discussed with reading radiologist regarding a PET scan as well.  Patient has a 2.1 cm lytic lesion in the proximal left radial diaphysis with associated cortical destruction.  On PET scan, this lesion is mildly hypermetabolic.  Radiologist is not able to provide an SUV number. I discussed with patient's wife and daughter regarding recommendations of bone marrow biopsy as well as bone lesion biopsy.  Wife and daughter feel that patient is currently asymptomatic, given his age, and other medical problems,[patient needs to have pacemaker replaced in the near future], family members prefer to defer further workup at this point.  They will keep same follow-up appointment with me.

## 2022-03-17 NOTE — Telephone Encounter (Signed)
Wife Charles Townsend called reporting that they see they results from PET and is asking if you will call to discuss or give an appointment to discuss results. He does not come back until August to see you.   IMPRESSION: 1. The lytic lesion involving the left radius was not covered on the CT scan but I believe on the PET images it is mildly hypermetabolic. 2. No other hypermetabolic bone lesions to suggest metabolically active multiple myeloma. 3. No significant findings in the chest, abdomen or pelvis.   Aortic Atherosclerosis (ICD10-I70.0).     Electronically Signed   By: Marijo Sanes M.D.   On: 03/16/2022 13:39

## 2022-03-25 ENCOUNTER — Other Ambulatory Visit: Payer: Medicare Other

## 2022-03-25 ENCOUNTER — Ambulatory Visit: Payer: Medicare Other | Admitting: Oncology

## 2022-03-31 ENCOUNTER — Ambulatory Visit: Admit: 2022-03-31 | Payer: Medicare Other | Admitting: Cardiology

## 2022-03-31 DIAGNOSIS — Z45010 Encounter for checking and testing of cardiac pacemaker pulse generator [battery]: Secondary | ICD-10-CM

## 2022-03-31 SURGERY — PPM GENERATOR CHANGEOUT
Anesthesia: Moderate Sedation

## 2022-05-20 ENCOUNTER — Other Ambulatory Visit: Payer: Self-pay | Admitting: Infectious Diseases

## 2022-05-20 ENCOUNTER — Ambulatory Visit
Admission: RE | Admit: 2022-05-20 | Discharge: 2022-05-20 | Disposition: A | Discharge status: Home / Self Care | Payer: Medicare Other | Source: Ambulatory Visit | Attending: Infectious Diseases | Admitting: Infectious Diseases

## 2022-05-20 DIAGNOSIS — S0990XD Unspecified injury of head, subsequent encounter: Secondary | ICD-10-CM | POA: Diagnosis present

## 2022-05-20 DIAGNOSIS — Z7901 Long term (current) use of anticoagulants: Secondary | ICD-10-CM | POA: Insufficient documentation

## 2022-08-10 ENCOUNTER — Inpatient Hospital Stay: Payer: Medicare Other | Attending: Oncology

## 2022-08-10 DIAGNOSIS — Z7901 Long term (current) use of anticoagulants: Secondary | ICD-10-CM | POA: Diagnosis not present

## 2022-08-10 DIAGNOSIS — R0602 Shortness of breath: Secondary | ICD-10-CM | POA: Diagnosis not present

## 2022-08-10 DIAGNOSIS — Z800 Family history of malignant neoplasm of digestive organs: Secondary | ICD-10-CM | POA: Insufficient documentation

## 2022-08-10 DIAGNOSIS — Z86711 Personal history of pulmonary embolism: Secondary | ICD-10-CM | POA: Diagnosis not present

## 2022-08-10 DIAGNOSIS — Z8051 Family history of malignant neoplasm of kidney: Secondary | ICD-10-CM | POA: Insufficient documentation

## 2022-08-10 DIAGNOSIS — Z803 Family history of malignant neoplasm of breast: Secondary | ICD-10-CM | POA: Diagnosis not present

## 2022-08-10 DIAGNOSIS — D472 Monoclonal gammopathy: Secondary | ICD-10-CM | POA: Insufficient documentation

## 2022-08-10 DIAGNOSIS — Z801 Family history of malignant neoplasm of trachea, bronchus and lung: Secondary | ICD-10-CM | POA: Diagnosis not present

## 2022-08-10 LAB — CBC WITH DIFFERENTIAL/PLATELET
Abs Immature Granulocytes: 0 10*3/uL (ref 0.00–0.07)
Basophils Absolute: 0 10*3/uL (ref 0.0–0.1)
Basophils Relative: 1 %
Eosinophils Absolute: 0.5 10*3/uL (ref 0.0–0.5)
Eosinophils Relative: 11 %
HCT: 36.9 % — ABNORMAL LOW (ref 39.0–52.0)
Hemoglobin: 12.2 g/dL — ABNORMAL LOW (ref 13.0–17.0)
Immature Granulocytes: 0 %
Lymphocytes Relative: 30 %
Lymphs Abs: 1.3 10*3/uL (ref 0.7–4.0)
MCH: 30.7 pg (ref 26.0–34.0)
MCHC: 33.1 g/dL (ref 30.0–36.0)
MCV: 92.7 fL (ref 80.0–100.0)
Monocytes Absolute: 0.2 10*3/uL (ref 0.1–1.0)
Monocytes Relative: 5 %
Neutro Abs: 2.3 10*3/uL (ref 1.7–7.7)
Neutrophils Relative %: 53 %
Platelets: 233 10*3/uL (ref 150–400)
RBC: 3.98 MIL/uL — ABNORMAL LOW (ref 4.22–5.81)
RDW: 12.6 % (ref 11.5–15.5)
WBC: 4.3 10*3/uL (ref 4.0–10.5)
nRBC: 0 % (ref 0.0–0.2)

## 2022-08-10 LAB — LACTATE DEHYDROGENASE: LDH: 128 U/L (ref 98–192)

## 2022-08-10 LAB — COMPREHENSIVE METABOLIC PANEL
ALT: 18 U/L (ref 0–44)
AST: 18 U/L (ref 15–41)
Albumin: 3.9 g/dL (ref 3.5–5.0)
Alkaline Phosphatase: 45 U/L (ref 38–126)
Anion gap: 8 (ref 5–15)
BUN: 13 mg/dL (ref 8–23)
CO2: 26 mmol/L (ref 22–32)
Calcium: 9.3 mg/dL (ref 8.9–10.3)
Chloride: 100 mmol/L (ref 98–111)
Creatinine, Ser: 1.2 mg/dL (ref 0.61–1.24)
GFR, Estimated: >60 mL/min (ref >60)
Glucose, Bld: 184 mg/dL — ABNORMAL HIGH (ref 70–99)
Potassium: 4.2 mmol/L (ref 3.5–5.1)
Sodium: 134 mmol/L — ABNORMAL LOW (ref 135–145)
Total Bilirubin: 0.8 mg/dL (ref 0.3–1.2)
Total Protein: 6.9 g/dL (ref 6.5–8.1)

## 2022-08-13 ENCOUNTER — Inpatient Hospital Stay (HOSPITAL_BASED_OUTPATIENT_CLINIC_OR_DEPARTMENT_OTHER): Payer: Medicare Other | Admitting: Oncology

## 2022-08-13 ENCOUNTER — Encounter: Payer: Self-pay | Admitting: Oncology

## 2022-08-13 VITALS — BP 153/82 | HR 87 | Temp 97.8°F | Resp 18 | Wt 201.9 lb

## 2022-08-13 DIAGNOSIS — D472 Monoclonal gammopathy: Secondary | ICD-10-CM | POA: Diagnosis not present

## 2022-08-13 DIAGNOSIS — I2609 Other pulmonary embolism with acute cor pulmonale: Secondary | ICD-10-CM

## 2022-08-13 MED ORDER — APIXABAN 2.5 MG PO TABS: 2.5000 mg | ORAL_TABLET | Freq: Two times a day (BID) | ORAL | 5 refills | Status: DC

## 2022-08-13 NOTE — Progress Notes (Signed)
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 Less frequently, these patients progress to AL amyloidosis, light chain deposition disease, or another lymphoproliferative disorder. Labs are reviewed and discussed with patient Lab Results  Component Value Date   MPROTEIN 0.3 (H) 02/12/2022   KPAFRELGTCHN 17.8 08/10/2022   LAMBDASER 18.9 08/10/2022   KAPLAMBRATIO 0.94 08/10/2022   Left forearm lesion on skeletal survey and PET scan, discussed results.  I discussed with reading radiologist regarding a PET scan as well.  Patient has a 2.1 cm lytic lesion in the proximal left radial diaphysis with associated cortical destruction.  On PET scan, this lesion is mildly hypermetabolic.  Radiologist is not able to provide an SUV number. I discussed with patient's wife and daughter regarding recommendations of bone marrow biopsy as well as bone lesion biopsy.  patient is currently asymptomatic, given his age, and other medical problems, patient and family prefer to defer further workup at this point.  I recommend observation. .    Pulmonary embolism (HCC) #History of bilateral submassive pulmonary embolism Clinically he is doing well and has finished 6 months of anticoagulation.  CT in June 2023 showed resolution of pulmonary embolism.  Continue Eliquis to 2.5 mg twice daily for long term anticoagulation maintenance    Orders Placed This Encounter  Procedures   Miscellaneous [facility] test (send-out)    Standing Status:   Future    Standing Expiration Date:   08/13/2023    Order Specific Question:    Test name / description:    Answer:   NT-proBNP [facility] [id number]   CBC with Differential (Cancer Center Only)    Standing Status:   Future    Standing Expiration Date:   08/13/2023   Kappa/lambda light chains    Standing Status:   Future    Standing Expiration Date:   08/13/2023   Multiple Myeloma Panel (SPEP&IFE w/QIG)    Standing Status:   Future    Standing Expiration Date:   08/13/2023   CMP (Cancer Center only)    Standing Status:   Future    Standing Expiration Date:   08/13/2023   Beta 2 microglobulin, serum    Standing Status:   Future    Standing Expiration Date:   08/13/2023   Lactate dehydrogenase    Standing Status:   Future    Standing Expiration Date:   08/13/2023   Follow up in 6 months.  All questions were answered. The patient knows to call the clinic with any problems, questions or concerns.  Rickard Patience, MD, PhD [facility] Hematology Oncology 08/13/2022    HISTORY OF PRESENTING ILLNESS:   Charles Townsend is a  82 y.o.  male with PMH listed below was seen in consultation at the request of  Mick Sell, MD  for evaluation of pulmonary embolism 03/13/2021 - 03/15/2021 patient was recently admitted due to acute pulmonary embolism.  Patient has been feeling shortness of breath for the past 6 months.  Shortness of breath is progressively worsened. He also had 5-hour car ride from [facility] prior.  However per patient, shortness of breath has been an  ongoing issue for him even prior to the trip.  Patient has sedentary lifestyle. Patient had outpatient CT coronary study done on 03/13/2021 which showed incidental findings of large volume, bilateral pulmonary embolism with CT evidence of right heart strain-at least submassive PE.  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  Patient was asked to go to emergency room for further evaluation and treatments.  Patient was seen by vascular surgery and declined embolectomy, patient was treated with IV heparin and then transition to  Eliquis prior to discharge.  Bilateral lower extremity ultrasounds were negative for DVT.  His echocardiogram during hospitalization showed LVEF of 55-60%.  Diastolic function was indeterminate.  Hypercoagulable work-up is negative.  Negative factor V Leiden mutation, negative prothrombin gene mutation.  Negative antiphospholipid syndrome antibody, negative beta-2 glycoprotein antibodies.  INTERVAL HISTORY Charles Townsend is a 82 y.o. male who has above history reviewed by me today presents for follow up visit for management of pulmonary embolism. Patient is on Eliquis 2.5 mg twice daily.  No bleeding events, chronic SOB improved No new complaints. No new bone pain  Review of Systems  Constitutional:  Negative for appetite change, chills, fatigue, fever and unexpected weight change.  HENT:   Negative for hearing loss and voice change.   Eyes:  Negative for eye problems and icterus.  Respiratory:  Positive for shortness of breath. Negative for chest tightness and cough.   Cardiovascular:  Negative for chest pain and leg swelling.  Gastrointestinal:  Negative for abdominal distention and abdominal pain.  Endocrine: Negative for hot flashes.  Genitourinary:  Negative for difficulty urinating, dysuria and frequency.   Musculoskeletal:  Positive for gait problem. Negative for arthralgias.  Skin:  Negative for itching and rash.  Neurological:  Positive for gait problem. Negative for light-headedness and numbness.  Hematological:  Negative for adenopathy. Does not bruise/bleed easily.  Psychiatric/Behavioral:  Negative for confusion.     MEDICAL HISTORY:  Past Medical History:  Diagnosis Date   Diabetes mellitus (HCC)    HLD (hyperlipidemia)    HTN (hypertension)    Pulmonary embolism (HCC)     SURGICAL HISTORY: History reviewed. No pertinent surgical history.  SOCIAL HISTORY: Social History   Socioeconomic History   Marital status: Married    Spouse name: Not on file   Number of  children: Not on file   Years of education: Not on file   Highest education level: Not on file  Occupational History   Not on file  Tobacco Use   Smoking status: Never   Smokeless tobacco: Never  Substance and Sexual Activity   Alcohol use: Not on file   Drug use: Not on file   Sexual activity: Not on file  Other Topics Concern   Not on file  Social History Narrative   Not on file   Social Determinants of Health   Financial Resource Strain: Low Risk  (07/29/2022)   Received [facility]   Overall Financial Resource Strain (CARDIA)    Difficulty of Paying Living Expenses: Not hard at all  Food Insecurity: No Food Insecurity (07/29/2022)   Received from [facility]   Hunger Vital Sign    Worried About Running Out of Food in the Last Year: Never true    Ran Out of Food in the Last Year: Never true  Transportation Needs: No Transportation Needs (07/29/2022)   Received from [facility] - Transportation    In the past  12 months, has lack of transportation kept you from medical appointments or from getting medications?: No    Lack of Transportation (Non-Medical): No  Physical Activity: Not on file  Stress: Not on file  Social Connections: Not on file  Intimate Partner Violence: Not on file    FAMILY HISTORY: Family History  Problem Relation Age of Onset   Kidney cancer Mother    Colon cancer Father    Diabetes Sister    Breast cancer Sister    Diabetes Brother    Diabetes Brother    Diabetes Brother    Lung cancer Brother    Diabetes Maternal Grandmother     ALLERGIES:  is allergic to lisinopril and metoprolol.  MEDICATIONS:  Current Outpatient Medications  Medication Sig Dispense Refill   atorvastatin (LIPITOR) 20 MG tablet Take 20 mg by mouth daily.     calcium-vitamin D (OSCAL WITH D) 500-200 MG-UNIT tablet Take 1 tablet by mouth.     Insulin Glargine (BASAGLAR KWIKPEN) 100 UNIT/ML Inject 22 Units  into the skin daily.     losartan (COZAAR) 25 MG tablet Take 25 mg by mouth daily.     metFORMIN (GLUCOPHAGE-XR) 500 MG 24 hr tablet Take 2,000 mg by mouth at bedtime.     vitamin B-12 (CYANOCOBALAMIN) 500 MCG tablet Take 500 mcg by mouth daily.     apixaban (ELIQUIS) 2.5 MG TABS tablet Take 1 tablet (2.5 mg total) by mouth 2 (two) times daily. 60 tablet 5   No current facility-administered medications for this visit.     PHYSICAL EXAMINATION: ECOG PERFORMANCE STATUS: 1 - Symptomatic but completely ambulatory Vitals:   08/13/22 1027  BP: (!) 153/82  Pulse: 87  Resp: 18  Temp: 97.8 F (36.6 C)  SpO2: 95%   Filed Weights   08/13/22 1027  Weight: 201 lb 14.4 oz (91.6 kg)    Physical Exam Constitutional:      General: He is not in acute distress. HENT:     Head: Normocephalic and atraumatic.  Eyes:     General: No scleral icterus.    Comments: Right eye ptosis.   Cardiovascular:     Rate and Rhythm: Normal rate and regular rhythm.     Heart sounds: Normal heart sounds.  Pulmonary:     Effort: Pulmonary effort is normal. No respiratory distress.     Breath sounds: No wheezing.  Abdominal:     General: Bowel sounds are normal. There is no distension.     Palpations: Abdomen is soft.  Musculoskeletal:        General: No deformity. Normal range of motion.     Cervical back: Normal range of motion and neck supple.  Skin:    General: Skin is warm and dry.     Findings: No erythema or rash.  Neurological:     Mental Status: He is alert and oriented to person, place, and time. Mental status is at baseline.     Cranial Nerves: No cranial nerve deficit.     Coordination: Coordination normal.  Psychiatric:        Mood and Affect: Mood normal.     LABORATORY DATA:  I have reviewed the data as listed     Latest Ref Rng & Units 08/10/2022    1:09 PM 02/12/2022   11:32 AM 09/22/2021   10:17 AM  CBC  WBC 4.0 - 10.5 K/uL 4.3  4.0  4.4   Hemoglobin 13.0 - 17.0 g/dL 09.6  04.5   13.3  Hematocrit 39.0 - 52.0 % 36.9  40.0  40.8   Platelets 150 - 400 K/uL 233  234  233       Latest Ref Rng & Units 08/10/2022    1:09 PM 02/12/2022   11:32 AM 09/22/2021   10:17 AM  CMP  Glucose 70 - 99 mg/dL 161  82  096   BUN 8 - 23 mg/dL 13  17  17    Creatinine 0.61 - 1.24 mg/dL 0.45  4.09  8.11   Sodium 135 - 145 mmol/L 134  140  139   Potassium 3.5 - 5.1 mmol/L 4.2  4.5  5.0   Chloride 98 - 111 mmol/L 100  105  105   CO2 22 - 32 mmol/L 26  27  28    Calcium 8.9 - 10.3 mg/dL 9.3  9.7  9.7   Total Protein 6.5 - 8.1 g/dL 6.9  7.5  7.4   Total Bilirubin 0.3 - 1.2 mg/dL 0.8  1.0  0.7   Alkaline Phos 38 - 126 U/L 45  42  38   AST 15 - 41 U/L 18  19  17    ALT 0 - 44 U/L 18  17  17        RADIOGRAPHIC STUDIES: I have personally reviewed the radiological images as listed and agreed with the findings in the report. CT HEAD WO CONTRAST ( )  Result Date: 05/20/2022 CLINICAL DATA:  Fall striking back of head today EXAM: CT HEAD WITHOUT CONTRAST TECHNIQUE: Contiguous axial images were obtained from the base of the skull through the vertex without intravenous contrast. RADIATION DOSE REDUCTION: This exam was performed according to the departmental dose-optimization program which includes automated exposure control, adjustment of the mA and/or kV according to patient size and/or use of iterative reconstruction technique. COMPARISON:  04/22/2020 FINDINGS: Brain: The brainstem, cerebellum, cerebral peduncles, thalami, basal ganglia, basilar cisterns, and ventricular system appear within normal limits. Periventricular white matter and corona radiata hypodensities favor chronic ischemic microvascular white matter disease. No intracranial hemorrhage, mass lesion, or acute CVA. Vascular: There is atherosclerotic calcification of the cavernous carotid arteries bilaterally. 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 Other: No supplemental  non-categorized findings. IMPRESSION: 1. No acute intracranial findings. 2. Periventricular white matter and corona radiata hypodensities favor chronic ischemic microvascular white matter disease. 3. Atherosclerosis. 4. 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 Electronically Signed   By: Gaylyn Rong M.D.   On: 05/20/2022 16:11

## 2022-08-13 NOTE — Assessment & Plan Note (Signed)
#  History of bilateral submassive pulmonary embolism Clinically he is doing well and has finished 6 months of anticoagulation.  CT in June 2023 showed resolution of pulmonary embolism.  Continue Eliquis to 2.5 mg twice daily for long term anticoagulation maintenance

## 2022-08-13 NOTE — Assessment & Plan Note (Addendum)
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 Less frequently, these patients progress to AL amyloidosis, light chain deposition disease, or another lymphoproliferative disorder. Labs are reviewed and discussed with patient Lab Results  Component Value Date   MPROTEIN 0.3 (H) 02/12/2022   KPAFRELGTCHN 17.8 08/10/2022   LAMBDASER 18.9 08/10/2022   KAPLAMBRATIO 0.94 08/10/2022   Left forearm lesion on skeletal survey and PET scan, discussed results.  I discussed with reading radiologist regarding a PET scan as well.  Patient has a 2.1 cm lytic lesion in the proximal left radial diaphysis with associated cortical destruction.  On PET scan, this lesion is mildly hypermetabolic.  Radiologist is not able to provide an SUV number. I discussed with patient's wife and daughter regarding recommendations of bone marrow biopsy as well as bone lesion biopsy.  patient is currently asymptomatic, given his age, and other medical problems, patient and family prefer to defer further workup at this point.  I recommend observation. Marland Kitchen

## 2022-08-21 ENCOUNTER — Other Ambulatory Visit: Payer: Medicare Other

## 2022-08-28 ENCOUNTER — Ambulatory Visit: Payer: Medicare Other | Admitting: Oncology

## 2023-02-08 ENCOUNTER — Inpatient Hospital Stay: Payer: Medicare Other | Attending: Oncology

## 2023-02-08 DIAGNOSIS — R0602 Shortness of breath: Secondary | ICD-10-CM | POA: Diagnosis not present

## 2023-02-08 DIAGNOSIS — R0981 Nasal congestion: Secondary | ICD-10-CM | POA: Insufficient documentation

## 2023-02-08 DIAGNOSIS — Z800 Family history of malignant neoplasm of digestive organs: Secondary | ICD-10-CM | POA: Diagnosis not present

## 2023-02-08 DIAGNOSIS — Z8051 Family history of malignant neoplasm of kidney: Secondary | ICD-10-CM | POA: Insufficient documentation

## 2023-02-08 DIAGNOSIS — Z7901 Long term (current) use of anticoagulants: Secondary | ICD-10-CM | POA: Diagnosis not present

## 2023-02-08 DIAGNOSIS — R059 Cough, unspecified: Secondary | ICD-10-CM | POA: Insufficient documentation

## 2023-02-08 DIAGNOSIS — D472 Monoclonal gammopathy: Secondary | ICD-10-CM | POA: Insufficient documentation

## 2023-02-08 DIAGNOSIS — R5383 Other fatigue: Secondary | ICD-10-CM | POA: Insufficient documentation

## 2023-02-08 DIAGNOSIS — Z86711 Personal history of pulmonary embolism: Secondary | ICD-10-CM | POA: Diagnosis not present

## 2023-02-08 DIAGNOSIS — Z801 Family history of malignant neoplasm of trachea, bronchus and lung: Secondary | ICD-10-CM | POA: Diagnosis not present

## 2023-02-08 DIAGNOSIS — Z803 Family history of malignant neoplasm of breast: Secondary | ICD-10-CM | POA: Insufficient documentation

## 2023-02-08 LAB — CBC WITH DIFFERENTIAL (CANCER CENTER ONLY)
Abs Immature Granulocytes: 0.01 10*3/uL (ref 0.00–0.07)
Basophils Absolute: 0 10*3/uL (ref 0.0–0.1)
Basophils Relative: 1 %
Eosinophils Absolute: 0.6 10*3/uL — ABNORMAL HIGH (ref 0.0–0.5)
Eosinophils Relative: 13 %
HCT: 36.8 % — ABNORMAL LOW (ref 39.0–52.0)
Hemoglobin: 12.2 g/dL — ABNORMAL LOW (ref 13.0–17.0)
Immature Granulocytes: 0 %
Lymphocytes Relative: 31 %
Lymphs Abs: 1.3 10*3/uL (ref 0.7–4.0)
MCH: 31.1 pg (ref 26.0–34.0)
MCHC: 33.2 g/dL (ref 30.0–36.0)
MCV: 93.9 fL (ref 80.0–100.0)
Monocytes Absolute: 0.2 10*3/uL (ref 0.1–1.0)
Monocytes Relative: 5 %
Neutro Abs: 2.1 10*3/uL (ref 1.7–7.7)
Neutrophils Relative %: 50 %
Platelet Count: 225 10*3/uL (ref 150–400)
RBC: 3.92 MIL/uL — ABNORMAL LOW (ref 4.22–5.81)
RDW: 12.8 % (ref 11.5–15.5)
WBC Count: 4.2 10*3/uL (ref 4.0–10.5)
nRBC: 0 % (ref 0.0–0.2)

## 2023-02-08 LAB — CMP (CANCER CENTER ONLY)
ALT: 16 U/L (ref 0–44)
AST: 17 U/L (ref 15–41)
Albumin: 4.1 g/dL (ref 3.5–5.0)
Alkaline Phosphatase: 41 U/L (ref 38–126)
Anion gap: 4 — ABNORMAL LOW (ref 5–15)
BUN: 17 mg/dL (ref 8–23)
CO2: 25 mmol/L (ref 22–32)
Calcium: 9.2 mg/dL (ref 8.9–10.3)
Chloride: 105 mmol/L (ref 98–111)
Creatinine: 1.09 mg/dL (ref 0.61–1.24)
GFR, Estimated: >60 mL/min (ref >60)
Glucose, Bld: 82 mg/dL (ref 70–99)
Potassium: 4.4 mmol/L (ref 3.5–5.1)
Sodium: 134 mmol/L — ABNORMAL LOW (ref 135–145)
Total Bilirubin: 0.9 mg/dL (ref 0.0–1.2)
Total Protein: 7 g/dL (ref 6.5–8.1)

## 2023-02-08 LAB — LACTATE DEHYDROGENASE: LDH: 112 U/L (ref 98–192)

## 2023-02-09 LAB — MISC LABCORP TEST (SEND OUT): Labcorp test code: 143000

## 2023-02-09 LAB — BETA 2 MICROGLOBULIN, SERUM: Beta-2 Microglobulin: 1.7 mg/L (ref 0.6–2.4)

## 2023-02-09 LAB — KAPPA/LAMBDA LIGHT CHAINS
Kappa free light chain: 19.1 mg/L (ref 3.3–19.4)
Kappa, lambda light chain ratio: 1.1 (ref 0.26–1.65)
Lambda free light chains: 17.4 mg/L (ref 5.7–26.3)

## 2023-02-11 ENCOUNTER — Ambulatory Visit: Payer: Medicare Other | Admitting: Oncology

## 2023-02-11 LAB — MULTIPLE MYELOMA PANEL, SERUM
Albumin SerPl Elph-Mcnc: 3.7 g/dL (ref 2.9–4.4)
Albumin/Glob SerPl: 1.4 (ref 0.7–1.7)
Alpha 1: 0.2 g/dL (ref 0.0–0.4)
Alpha2 Glob SerPl Elph-Mcnc: 0.6 g/dL (ref 0.4–1.0)
B-Globulin SerPl Elph-Mcnc: 0.8 g/dL (ref 0.7–1.3)
Gamma Glob SerPl Elph-Mcnc: 1 g/dL (ref 0.4–1.8)
Globulin, Total: 2.7 g/dL (ref 2.2–3.9)
IgA: 377 mg/dL (ref 61–437)
IgG (Immunoglobin G), Serum: 853 mg/dL (ref 603–1613)
IgM (Immunoglobulin M), Srm: 54 mg/dL (ref 15–143)
M Protein SerPl Elph-Mcnc: 0.5 g/dL — ABNORMAL HIGH
Total Protein ELP: 6.4 g/dL (ref 6.0–8.5)

## 2023-02-17 ENCOUNTER — Inpatient Hospital Stay: Payer: Medicare Other | Admitting: Oncology

## 2023-02-17 ENCOUNTER — Encounter: Payer: Self-pay | Admitting: Oncology

## 2023-02-17 DIAGNOSIS — U071 COVID-19: Secondary | ICD-10-CM

## 2023-02-17 DIAGNOSIS — D472 Monoclonal gammopathy: Secondary | ICD-10-CM

## 2023-02-17 DIAGNOSIS — I2609 Other pulmonary embolism with acute cor pulmonale: Secondary | ICD-10-CM

## 2023-02-17 MED ORDER — APIXABAN 2.5 MG PO TABS: 2.5000 mg | ORAL_TABLET | Freq: Two times a day (BID) | ORAL | 5 refills | Status: DC

## 2023-02-17 NOTE — Assessment & Plan Note (Deleted)
 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 Less frequently, these patients progress to AL amyloidosis, light chain deposition disease, or another lymphoproliferative disorder. Labs are reviewed and discussed with patient Lab Results  Component Value Date   MPROTEIN 0.5 (H) 02/08/2023   KPAFRELGTCHN 19.1 02/08/2023   LAMBDASER 17.4 02/08/2023   KAPLAMBRATIO 1.10 02/08/2023   Left forearm lesion on skeletal survey and PET scan, discussed results.  I discussed with reading radiologist regarding a PET scan as well.  Patient has a 2.1 cm lytic lesion in the proximal left radial diaphysis with associated cortical destruction.  On PET scan, this lesion is mildly hypermetabolic.  Radiologist is not able to provide an SUV number. I discussed with patient's wife and daughter regarding recommendations of bone marrow biopsy as well as bone lesion biopsy.  patient is currently asymptomatic, given his age, and other medical problems, patient and family prefer to defer further workup at this point.  I recommend observation. Marland Kitchen

## 2023-02-17 NOTE — Assessment & Plan Note (Signed)
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 Less frequently, these patients progress to AL amyloidosis, light chain deposition disease, or another lymphoproliferative disorder. Labs are reviewed and discussed with patient Lab Results  Component Value Date   MPROTEIN 0.5 (H) 02/08/2023   KPAFRELGTCHN 19.1 02/08/2023   LAMBDASER 17.4 02/08/2023   KAPLAMBRATIO 1.10 02/08/2023   M protein slightly increased. Previously discussed about option of proceeding with bone marrow biopsy as well as bone lesion biopsy and due to patient's age and multiple medical problems, patient and family prefer to defer. I recommend observation and trend labs.Marland Kitchen Marland Kitchen

## 2023-02-17 NOTE — Addendum Note (Signed)
Addended by: Rickard Patience on: 02/17/2023 08:22 PM   Modules accepted: Orders

## 2023-02-17 NOTE — Assessment & Plan Note (Signed)
#  History of bilateral submassive pulmonary embolism Clinically he is doing well and has finished 6 months of anticoagulation.  CT in June 2023 showed resolution of pulmonary embolism.  Continue Eliquis to 2.5 mg twice daily for long term anticoagulation maintenance

## 2023-02-17 NOTE — Progress Notes (Signed)
HEMATOLOGY-ONCOLOGY TeleHEALTH VISIT PROGRESS NOTE  I connected with Charles Townsend on 02/17/23  at  3:30 PM EST by video enabled telemedicine visit and verified that I am speaking with the correct person using two identifiers. I discussed the limitations, risks, security and privacy concerns of performing an evaluation and management service by telemedicine and the availability of in-person appointments. The patient expressed understanding and agreed to proceed.   Other persons participating in the visit and their role in the encounter:  Daughter  Patient's location: Home  Provider's location: office Chief Complaint: MGUS, history of PE   INTERVAL HISTORY Charles Townsend is a 83 y.o. male who has above history reviewed by me today presents for follow up visit for management of MGUS and history of PE He had an appointment with me in person and has to switch to virtual visit. Patient reports that recently he was diagnosed with COVID infection.  He was seen at [facility] emergency room.   Paxlovid  was deferred due to patient being on Eliquis.  He had a negative x-ray + Nasal congestion, cough.  No shortness of breath.  Review of Systems  Constitutional:  Positive for fatigue. Negative for appetite change, chills, fever and unexpected weight change.  HENT:   Negative for voice change.   Eyes:  Negative for eye problems and icterus.  Respiratory:  Negative for chest tightness and cough.        COVID19 infection  Cardiovascular:  Negative for chest pain and leg swelling.  Gastrointestinal:  Negative for abdominal distention and abdominal pain.  Endocrine: Negative for hot flashes.  Genitourinary:  Negative for difficulty urinating, dysuria and frequency.   Musculoskeletal:  Negative for arthralgias.  Skin:  Negative for itching and rash.  Neurological:  Negative for light-headedness and numbness.  Hematological:  Negative for adenopathy. Does not bruise/bleed easily.  Psychiatric/Behavioral:   Negative for confusion.     Past Medical History:  Diagnosis Date   Diabetes mellitus (HCC)    HLD (hyperlipidemia)    HTN (hypertension)    Pulmonary embolism (HCC)    History reviewed. No pertinent surgical history.  Family History  Problem Relation Age of Onset   Kidney cancer Mother    Colon cancer Father    Diabetes Sister    Breast cancer Sister    Diabetes Brother    Diabetes Brother    Diabetes Brother    Lung cancer Brother    Diabetes Maternal Grandmother     Social History   Socioeconomic History   Marital status: Married    Spouse name: Not on file   Number of children: Not on file   Years of education: Not on file   Highest education level: Not on file  Occupational History   Not on file  Tobacco Use   Smoking status: Never   Smokeless tobacco: Never  Substance and Sexual Activity   Alcohol use: Not on file   Drug use: Not on file   Sexual activity: Not on file  Other Topics Concern   Not on file  Social History Narrative   Not on file   Social Drivers of Health   Financial Resource Strain: Low Risk  (12/09/2022)   Received from [facility]   Overall Financial Resource Strain (CARDIA)    Difficulty of Paying Living Expenses: Not hard at all  Food Insecurity: No Food Insecurity (12/09/2022)   Received from [facility]   Hunger Vital Sign    Worried About  Running Out of Food in the Last Year: Never true    Ran Out of Food in the Last Year: Never true  Transportation Needs: No Transportation Needs (12/09/2022)   Received from [facility]   PRAPARE - Transportation    Lack of Transportation (Non-Medical): No    In the past 12 months, has lack of transportation kept you from medical appointments or from getting medications?: No  Physical Activity: Not on file  Stress: Not on file  Social Connections: Not on file  Intimate Partner Violence: Not on file    Current Outpatient Medications on File  Prior to Visit  Medication Sig Dispense Refill   apixaban (ELIQUIS) 2.5 MG TABS tablet Take 1 tablet (2.5 mg total) by mouth 2 (two) times daily. 60 tablet 5   atorvastatin (LIPITOR) 20 MG tablet Take 20 mg by mouth daily.     calcium-vitamin D (OSCAL WITH D) 500-200 MG-UNIT tablet Take 1 tablet by mouth.     Insulin Glargine (BASAGLAR KWIKPEN) 100 UNIT/ML Inject 22 Units into the skin daily.     losartan (COZAAR) 25 MG tablet Take 25 mg by mouth daily.     metFORMIN (GLUCOPHAGE-XR) 500 MG 24 hr tablet Take 2,000 mg by mouth at bedtime.     vitamin B-12 (CYANOCOBALAMIN) 500 MCG tablet Take 500 mcg by mouth daily.     No current facility-administered medications on file prior to visit.    Allergies  Allergen Reactions   Lisinopril Cough    cough cough cough    Metoprolol Cough       Observations/Objective: There were no vitals filed for this visit. There is no height or weight on file to calculate BMI.  Physical Exam Neurological:     Mental Status: He is alert.     CBC    Component Value Date/Time   WBC 4.2 02/08/2023 1315   WBC 4.3 08/10/2022 1309   RBC 3.92 (L) 02/08/2023 1315   HGB 12.2 (L) 02/08/2023 1315   HCT 36.8 (L) 02/08/2023 1315   PLT 225 02/08/2023 1315   MCV 93.9 02/08/2023 1315   MCH 31.1 02/08/2023 1315   MCHC 33.2 02/08/2023 1315   RDW 12.8 02/08/2023 1315   LYMPHSABS 1.3 02/08/2023 1315   MONOABS 0.2 02/08/2023 1315   EOSABS 0.6 (H) 02/08/2023 1315   BASOSABS 0.0 02/08/2023 1315    CMP     Component Value Date/Time   NA 134 (L) 02/08/2023 1315   K 4.4 02/08/2023 1315   CL 105 02/08/2023 1315   CO2 25 02/08/2023 1315   GLUCOSE 82 02/08/2023 1315   BUN 17 02/08/2023 1315   CREATININE 1.09 02/08/2023 1315   CALCIUM 9.2 02/08/2023 1315   PROT 7.0 02/08/2023 1315   ALBUMIN 4.1 02/08/2023 1315   AST 17 02/08/2023 1315   ALT 16 02/08/2023 1315   ALKPHOS 41 02/08/2023 1315   BILITOT 0.9 02/08/2023 1315   GFRNONAA >60 02/08/2023 1315      ASSESSMENT & PLAN:   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 Less frequently, these patients progress to AL amyloidosis, light chain deposition disease, or another lymphoproliferative disorder. Labs are reviewed and discussed with patient Lab Results  Component Value Date   MPROTEIN 0.5 (H) 02/08/2023   KPAFRELGTCHN 19.1 02/08/2023   LAMBDASER 17.4 02/08/2023   KAPLAMBRATIO 1.10 02/08/2023   M  protein slightly increased. Previously discussed about option of proceeding with bone marrow biopsy as well as bone lesion biopsy and due to patient's age and multiple medical problems, patient and family prefer to defer. I recommend observation and trend labs.. .    Pulmonary embolism (HCC) #History of bilateral submassive pulmonary embolism Clinically he is doing well and has finished 6 months of anticoagulation.  CT in June 2023 showed resolution of pulmonary embolism.  Continue Eliquis to 2.5 mg twice daily for long term anticoagulation maintenance    COVID-19 virus infection Seen at [facility] ED and was discharged with return precautions.  Continue supportive care. Recommend patient to follow-up with primary care provider.  Orders Placed This Encounter  Procedures   CMP (Cancer Center only)    Standing Status:   Future    Expected Date:   08/17/2023    Expiration Date:   02/17/2024   CBC with Differential (Cancer Center Only)    Standing Status:   Future    Expected Date:   08/17/2023    Expiration Date:   02/17/2024   Kappa/lambda light chains    Standing Status:   Future    Expected Date:   08/17/2023    Expiration Date:   02/17/2024   Multiple Myeloma Panel (SPEP&IFE w/QIG)    Standing Status:   Future    Expected Date:   08/17/2023    Expiration Date:   02/17/2024   Follow-up in 6 months. All questions were answered. The  patient knows to call the clinic with any problems, questions or concerns.  Rickard Patience, MD, PhD [facility] Hematology Oncology 02/17/2023  .  Rickard Patience, MD 02/17/2023 8:15 PM

## 2023-02-17 NOTE — Assessment & Plan Note (Signed)
Seen at [facility] ED and was discharged with return precautions.  Continue supportive care. Recommend patient to follow-up with primary care provider.

## 2023-02-18 ENCOUNTER — Ambulatory Visit: Payer: Medicare Other | Admitting: Oncology

## 2023-06-16 ENCOUNTER — Other Ambulatory Visit: Payer: Self-pay

## 2023-06-16 ENCOUNTER — Ambulatory Visit
Admission: RE | Admit: 2023-06-16 | Discharge: 2023-06-16 | Disposition: A | Discharge status: Home / Self Care | Source: Ambulatory Visit

## 2023-06-16 DIAGNOSIS — K117 Disturbances of salivary secretion: Secondary | ICD-10-CM | POA: Diagnosis present

## 2023-06-16 DIAGNOSIS — R4781 Slurred speech: Secondary | ICD-10-CM

## 2023-08-16 ENCOUNTER — Inpatient Hospital Stay: Payer: Medicare Other | Attending: Oncology

## 2023-08-16 DIAGNOSIS — D472 Monoclonal gammopathy: Secondary | ICD-10-CM | POA: Insufficient documentation

## 2023-08-16 LAB — CBC WITH DIFFERENTIAL (CANCER CENTER ONLY)
Abs Immature Granulocytes: 0.01 K/uL (ref 0.00–0.07)
Basophils Absolute: 0 K/uL (ref 0.0–0.1)
Basophils Relative: 1 %
Eosinophils Absolute: 0.7 K/uL — ABNORMAL HIGH (ref 0.0–0.5)
Eosinophils Relative: 14 %
HCT: 38.2 % — ABNORMAL LOW (ref 39.0–52.0)
Hemoglobin: 12.6 g/dL — ABNORMAL LOW (ref 13.0–17.0)
Immature Granulocytes: 0 %
Lymphocytes Relative: 28 %
Lymphs Abs: 1.3 K/uL (ref 0.7–4.0)
MCH: 31 pg (ref 26.0–34.0)
MCHC: 33 g/dL (ref 30.0–36.0)
MCV: 93.9 fL (ref 80.0–100.0)
Monocytes Absolute: 0.3 K/uL (ref 0.1–1.0)
Monocytes Relative: 6 %
Neutro Abs: 2.4 K/uL (ref 1.7–7.7)
Neutrophils Relative %: 51 %
Platelet Count: 214 K/uL (ref 150–400)
RBC: 4.07 MIL/uL — ABNORMAL LOW (ref 4.22–5.81)
RDW: 13 % (ref 11.5–15.5)
WBC Count: 4.7 K/uL (ref 4.0–10.5)
nRBC: 0 % (ref 0.0–0.2)

## 2023-08-16 LAB — CMP (CANCER CENTER ONLY)
ALT: 14 U/L (ref 0–44)
AST: 19 U/L (ref 15–41)
Albumin: 3.9 g/dL (ref 3.5–5.0)
Alkaline Phosphatase: 43 U/L (ref 38–126)
Anion gap: 10 (ref 5–15)
BUN: 17 mg/dL (ref 8–23)
CO2: 25 mmol/L (ref 22–32)
Calcium: 9.5 mg/dL (ref 8.9–10.3)
Chloride: 100 mmol/L (ref 98–111)
Creatinine: 1.11 mg/dL (ref 0.61–1.24)
GFR, Estimated: >60 mL/min (ref >60)
Glucose, Bld: 163 mg/dL — ABNORMAL HIGH (ref 70–99)
Potassium: 4.3 mmol/L (ref 3.5–5.1)
Sodium: 135 mmol/L (ref 135–145)
Total Bilirubin: 0.9 mg/dL (ref 0.0–1.2)
Total Protein: 6.9 g/dL (ref 6.5–8.1)

## 2023-08-17 LAB — KAPPA/LAMBDA LIGHT CHAINS
Kappa free light chain: 23 mg/L — ABNORMAL HIGH (ref 3.3–19.4)
Kappa, lambda light chain ratio: 1.1 (ref 0.26–1.65)
Lambda free light chains: 21 mg/L (ref 5.7–26.3)

## 2023-08-19 LAB — MULTIPLE MYELOMA PANEL, SERUM
Albumin SerPl Elph-Mcnc: 3.4 g/dL (ref 2.9–4.4)
Albumin/Glob SerPl: 1.2 (ref 0.7–1.7)
Alpha 1: 0.2 g/dL (ref 0.0–0.4)
Alpha2 Glob SerPl Elph-Mcnc: 0.7 g/dL (ref 0.4–1.0)
B-Globulin SerPl Elph-Mcnc: 0.9 g/dL (ref 0.7–1.3)
Gamma Glob SerPl Elph-Mcnc: 1.2 g/dL (ref 0.4–1.8)
Globulin, Total: 3 g/dL (ref 2.2–3.9)
IgA: 389 mg/dL (ref 61–437)
IgG (Immunoglobin G), Serum: 871 mg/dL (ref 603–1613)
IgM (Immunoglobulin M), Srm: 57 mg/dL (ref 15–143)
M Protein SerPl Elph-Mcnc: 0.5 g/dL — ABNORMAL HIGH
Total Protein ELP: 6.4 g/dL (ref 6.0–8.5)

## 2023-08-26 ENCOUNTER — Encounter: Payer: Self-pay | Admitting: Oncology

## 2023-08-26 ENCOUNTER — Inpatient Hospital Stay (HOSPITAL_BASED_OUTPATIENT_CLINIC_OR_DEPARTMENT_OTHER): Payer: Medicare Other | Admitting: Oncology

## 2023-08-26 VITALS — BP 140/74 | HR 99 | Temp 97.8°F | Resp 18 | Wt 193.0 lb

## 2023-08-26 DIAGNOSIS — D472 Monoclonal gammopathy: Secondary | ICD-10-CM | POA: Diagnosis not present

## 2023-08-26 DIAGNOSIS — D721 Eosinophilia, unspecified: Secondary | ICD-10-CM

## 2023-08-26 DIAGNOSIS — I2609 Other pulmonary embolism with acute cor pulmonale: Secondary | ICD-10-CM | POA: Diagnosis not present

## 2023-08-26 MED ORDER — APIXABAN 2.5 MG PO TABS: 2.5000 mg | ORAL_TABLET | Freq: Two times a day (BID) | ORAL | 5 refills | Status: DC

## 2023-08-26 MED ORDER — APIXABAN 2.5 MG PO TABS
2.5000 mg | ORAL_TABLET | Freq: Two times a day (BID) | ORAL | 5 refills | Status: DC
Start: 2023-08-26 — End: 2023-10-04

## 2023-08-26 NOTE — Assessment & Plan Note (Signed)
Chronic,mild absolute eosinophilia, likely reactive. Stable.  Continue observation.

## 2023-08-26 NOTE — Assessment & Plan Note (Addendum)
 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 Less frequently, these patients progress to AL amyloidosis, light chain deposition disease, or another lymphoproliferative disorder. Labs are reviewed and discussed with patient Lab Results  Component Value Date   MPROTEIN 0.5 (H) 08/16/2023   KPAFRELGTCHN 23.0 (H) 08/16/2023   LAMBDASER 21.0 08/16/2023   KAPLAMBRATIO 1.10 08/16/2023   M protein is stable I recommend observation. Repeat labs in 6 months

## 2023-08-26 NOTE — Assessment & Plan Note (Signed)
 #  History of bilateral submassive pulmonary embolism Clinically he is doing well and has finished 6 months of anticoagulation.  CT in June 2023 showed resolution of pulmonary embolism.  Continue Eliquis to 2.5 mg twice daily for long term anticoagulation maintenance

## 2023-08-26 NOTE — Progress Notes (Signed)
 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 Less frequently, these patients progress to AL amyloidosis, light chain deposition disease, or another lymphoproliferative disorder. Labs are reviewed and discussed with patient Lab Results  Component Value Date   MPROTEIN 0.5 (H) 08/16/2023   KPAFRELGTCHN 23.0 (H) 08/16/2023   LAMBDASER 21.0 08/16/2023   KAPLAMBRATIO 1.10 08/16/2023   M protein is stable I recommend observation. Repeat labs in 6 months  Pulmonary embolism (HCC) #History of bilateral submassive pulmonary embolism Clinically he is doing well and has finished 6 months of anticoagulation.  CT in June 2023 showed resolution of pulmonary embolism.  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  Continue observation.    Orders Placed This Encounter  Procedures   CBC with Differential (Cancer Center Only)    Standing Status:   Future    Expected Date:   02/26/2024    Expiration Date:   05/26/2024   CMP (Cancer Center only)    Standing Status:   Future    Expected Date:   02/26/2024    Expiration Date:   05/26/2024   Multiple Myeloma Panel (SPEP&IFE w/QIG)    Standing Status:   Future    Expected Date:   02/26/2024    Expiration Date:   05/26/2024   Kappa/lambda light chains    Standing Status:   Future    Expected Date:   02/26/2024    Expiration Date:   05/26/2024   Follow up in 6 months.  All questions were answered. The patient knows to call the clinic  with any problems, questions or concerns.  Call Babara, MD, PhD [facility] Hematology Oncology 08/26/2023    HISTORY OF PRESENTING ILLNESS:   Charles Townsend is a  83 y.o.  male with PMH listed below was seen in consultation at the request of  Epifanio Alm SQUIBB, MD  for evaluation of pulmonary embolism 03/13/2021 - 03/15/2021 patient was recently admitted due to acute pulmonary embolism.  Patient has been feeling shortness of breath for the past 6 months.  Shortness of breath is progressively worsened. He also had 5-hour car ride from [facility] prior.  However per patient, shortness of breath has been an ongoing issue for him even prior to the trip.  Patient has sedentary lifestyle. Patient had outpatient CT coronary study done on 03/13/2021 which showed incidental findings of large volume, bilateral pulmonary embolism with CT evidence of right heart strain-at least submassive PE.  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  Patient was asked to go to emergency room for further evaluation and treatments.  Patient was seen by vascular surgery and declined embolectomy, patient was treated with IV heparin  and then transition to Eliquis  prior to discharge.  Bilateral lower extremity ultrasounds were negative for DVT.  His echocardiogram during hospitalization showed LVEF of 55-60%.  Diastolic function was indeterminate.  Hypercoagulable work-up is negative.  Negative factor V Leiden mutation, negative prothrombin gene mutation.  Negative antiphospholipid syndrome antibody, negative beta-2  glycoprotein antibodies.  INTERVAL HISTORY Charles Townsend  is a 83 y.o. male who has above history reviewed by me today presents for follow up visit for management of pulmonary embolism. Patient is on Eliquis  2.5 mg twice daily.  No bleeding events, chronic SOB No new complaints. No new bone pain  Review of Systems  Constitutional:  Negative for appetite change, chills, fatigue, fever and unexpected weight  change.  HENT:   Negative for hearing loss and voice change.   Eyes:  Negative for eye problems and icterus.  Respiratory:  Positive for shortness of breath. Negative for chest tightness and cough.   Cardiovascular:  Negative for chest pain and leg swelling.  Gastrointestinal:  Negative for abdominal distention and abdominal pain.  Endocrine: Negative for hot flashes.  Genitourinary:  Negative for difficulty urinating, dysuria and frequency.   Musculoskeletal:  Positive for gait problem. Negative for arthralgias.  Skin:  Negative for itching and rash.  Neurological:  Positive for gait problem. Negative for light-headedness and numbness.  Hematological:  Negative for adenopathy. Does not bruise/bleed easily.  Psychiatric/Behavioral:  Negative for confusion.     MEDICAL HISTORY:  Past Medical History:  Diagnosis Date   Diabetes mellitus (HCC)    HLD (hyperlipidemia)    HTN (hypertension)    Pulmonary embolism (HCC)     SURGICAL HISTORY: History reviewed. No pertinent surgical history.  SOCIAL HISTORY: Social History   Socioeconomic History   Marital status: Married    Spouse name: Not on file   Number of children: Not on file   Years of education: Not on file   Highest education level: Not on file  Occupational History   Not on file  Tobacco Use   Smoking status: Never   Smokeless tobacco: Never  Substance and Sexual Activity   Alcohol use: Not on file   Drug use: Not on file   Sexual activity: Not on file  Other Topics Concern   Not on file  Social History Narrative   Not on file   Social Drivers of Health   Financial Resource Strain: Low Risk  (12/09/2022)   Received from [facility]   Overall Financial Resource Strain (CARDIA)    Difficulty of Paying Living Expenses: Not hard at all  Food Insecurity: No Food Insecurity (12/09/2022)   Received from [facility]   Hunger Vital Sign    Within the past 12 months, you worried  that your food would run out before you got the money to buy more.: Never true    Within the past 12 months, the food you bought just didn't last and you didn't have money to get more.: Never true  Transportation Needs: No Transportation Needs (12/09/2022)   Received from [facility]   PRAPARE - Transportation    Lack of Transportation (Non-Medical): No    In the past 12 months, has lack of transportation kept you from medical appointments or from getting medications?: No  Physical Activity: Not on file  Stress: Not on file  Social Connections: Not on file  Intimate Partner Violence: Not on file    FAMILY HISTORY: Family History  Problem Relation Age of Onset   Kidney cancer Mother    Colon cancer Father    Diabetes Sister    Breast cancer Sister    Diabetes Brother    Diabetes Brother    Diabetes Brother    Lung cancer Brother    Diabetes Maternal Grandmother     ALLERGIES:  is allergic to lisinopril and  metoprolol .  MEDICATIONS:  Current Outpatient Medications  Medication Sig Dispense Refill   atorvastatin  (LIPITOR) 20 MG tablet Take 20 mg by mouth daily.     calcium -vitamin D (OSCAL WITH D) 500-200 MG-UNIT tablet Take 1 tablet by mouth.     Insulin  Glargine (BASAGLAR KWIKPEN) 100 UNIT/ML Inject 22 Units into the skin daily.     losartan (COZAAR) 25 MG tablet Take 25 mg by mouth daily.     metFORMIN (GLUCOPHAGE-XR) 500 MG 24 hr tablet Take 2,000 mg by mouth at bedtime.     vitamin B-12 (CYANOCOBALAMIN ) 500 MCG tablet Take 500 mcg by mouth daily.     apixaban  (ELIQUIS ) 2.5 MG TABS tablet Take 1 tablet (2.5 mg total) by mouth 2 (two) times daily. 60 tablet 5   No current facility-administered medications for this visit.     PHYSICAL EXAMINATION: ECOG PERFORMANCE STATUS: 1 - Symptomatic but completely ambulatory Vitals:   08/26/23 1029  BP: (!) 140/74  Pulse: 99  Resp: 18  Temp: 97.8 F (36.6 C)  SpO2: 100%   Filed Weights   08/26/23 1029   Weight: 193 lb (87.5 kg)    Physical Exam Constitutional:      General: He is not in acute distress. HENT:     Head: Normocephalic and atraumatic.  Eyes:     General: No scleral icterus.    Comments: Right eye ptosis.   Cardiovascular:     Rate and Rhythm: Normal rate and regular rhythm.     Heart sounds: Normal heart sounds.  Pulmonary:     Effort: Pulmonary effort is normal. No respiratory distress.     Breath sounds: No wheezing.  Abdominal:     General: Bowel sounds are normal. There is no distension.     Palpations: Abdomen is soft.  Musculoskeletal:        General: No deformity. Normal range of motion.     Cervical back: Normal range of motion and neck supple.  Skin:    General: Skin is warm and dry.     Findings: No erythema or rash.  Neurological:     Mental Status: He is alert and oriented to person, place, and time. Mental status is at baseline.     Cranial Nerves: No cranial nerve deficit.     Coordination: Coordination normal.  Psychiatric:        Mood and Affect: Mood normal.     LABORATORY DATA:  I have reviewed the data as listed     Latest Ref Rng & Units 08/16/2023   12:47 PM 02/08/2023    1:15 PM 08/10/2022    1:09 PM  CBC  WBC 4.0 - 10.5 K/uL 4.7  4.2  4.3   Hemoglobin 13.0 - 17.0 g/dL 87.3  87.7  87.7   Hematocrit 39.0 - 52.0 % 38.2  36.8  36.9   Platelets 150 - 400 K/uL 214  225  233       Latest Ref Rng & Units 08/16/2023   12:47 PM 02/08/2023    1:15 PM 08/10/2022    1:09 PM  CMP  Glucose 70 - 99 mg/dL 836  82  815   BUN 8 - 23 mg/dL 17  17  13    Creatinine 0.61 - 1.24 mg/dL 8.88  8.90  8.79   Sodium 135 - 145 mmol/L 135  134  134   Potassium 3.5 - 5.1 mmol/L 4.3  4.4  4.2   Chloride 98 - 111 mmol/L 100  105  100   CO2 22 - 32 mmol/L 25  25  26    Calcium  8.9 - 10.3 mg/dL 9.5  9.2  9.3   Total Protein 6.5 - 8.1 g/dL 6.9  7.0  6.9   Total Bilirubin 0.0 - 1.2 mg/dL 0.9  0.9  0.8   Alkaline Phos 38 - 126 U/L 43  41  45   AST 15 - 41 U/L 19   17  18    ALT 0 - 44 U/L 14  16  18        RADIOGRAPHIC STUDIES: I have personally reviewed the radiological images as listed and agreed with the findings in the report. CT HEAD WO CONTRAST ( ) Result Date: 06/16/2023 CLINICAL DATA:  Slurred speech, sialorrhea EXAM: CT HEAD WITHOUT CONTRAST TECHNIQUE: Contiguous axial images were obtained from the base of the skull through the vertex without intravenous contrast. RADIATION DOSE REDUCTION: This exam was performed according to the departmental dose-optimization program which includes automated exposure control, adjustment of the mA and/or kV according to patient size and/or use of iterative reconstruction technique. COMPARISON:  05/20/2022 FINDINGS: Brain: Stable atrophy pattern and chronic white matter microvascular ischemic changes throughout both cerebral hemispheres. No acute intracranial hemorrhage, new infarction, mass lesion, midline shift, herniation, hydrocephalus, or extra-axial fluid collection. No focal mass effect or edema. Cerebellar atrophy as well. Vascular: No hyperdense vessel or unexpected calcification. Skull: Normal. Negative for fracture or focal lesion. Sinuses/Orbits: Right phthisis bulbi again noted. Left orbit unremarkable. Clear sinuses and mastoids. Other: None. IMPRESSION: 1. No acute intracranial abnormality by noncontrast CT. 2. Atrophy and chronic white matter microvascular ischemic changes. Electronically Signed   By: CHRISTELLA.  Shick M.D.   On: 06/16/2023 13:41

## 2023-08-26 NOTE — Progress Notes (Signed)
 Patient is doing ok, no new questions for the doctor today.

## 2023-10-04 ENCOUNTER — Telehealth: Payer: Self-pay | Admitting: *Deleted

## 2023-10-04 ENCOUNTER — Encounter: Payer: Self-pay | Admitting: Oncology

## 2023-10-04 ENCOUNTER — Other Ambulatory Visit: Payer: Self-pay

## 2023-10-04 MED ORDER — APIXABAN 2.5 MG PO TABS: 2.5000 mg | ORAL_TABLET | Freq: Two times a day (BID) | ORAL | 5 refills | Status: DC

## 2023-10-04 NOTE — Telephone Encounter (Signed)
 The wife is called and said that they need a 90-day supply of Eliquis  rather than 30-day supply .  The wife said that [facility] could change it to 90 pills because it is cheaper and they send it in the mail to them. They also put a MyChart asking the same thing

## 2023-10-04 NOTE — Telephone Encounter (Signed)
 Sent. I called patient no answer, left voicemail

## 2023-10-04 NOTE — Telephone Encounter (Signed)
 The wife called again saying that there is nothing there and I looked in the computer and it changed it to 90 and they put it in for [facility] for mail.  Left her message that she can give us  a call but that is in the computer it looks like it is already set up.  Gave her the telephone call number also

## 2023-10-05 ENCOUNTER — Other Ambulatory Visit: Payer: Self-pay

## 2023-10-05 MED ORDER — APIXABAN 2.5 MG PO TABS: 2.5000 mg | ORAL_TABLET | Freq: Two times a day (BID) | ORAL | 1 refills | Status: AC

## 2023-12-27 IMAGING — CT CT ANGIO CHEST
2 of 7 series · 17 of 46 positions shown · IV contrast (APPLIED)
Comparison: CT coronary angiogram 03/13/2021

CLINICAL DATA: Pulmonary embolism, previously identified on CT
coronary angiogram 03/13/2021

EXAM:
CT ANGIOGRAPHY CHEST WITH CONTRAST
TECHNIQUE: Multidetector CT imaging of the chest was performed using the
standard protocol during bolus administration of intravenous
contrast. Multiplanar CT image reconstructions and MIPs were
obtained to evaluate the vascular anatomy.

[Series 5: thins · axial · 0.65mm/px · z∈[-674,-418]mm · 14 of 355 slices shown]
[im 18/355  lung]
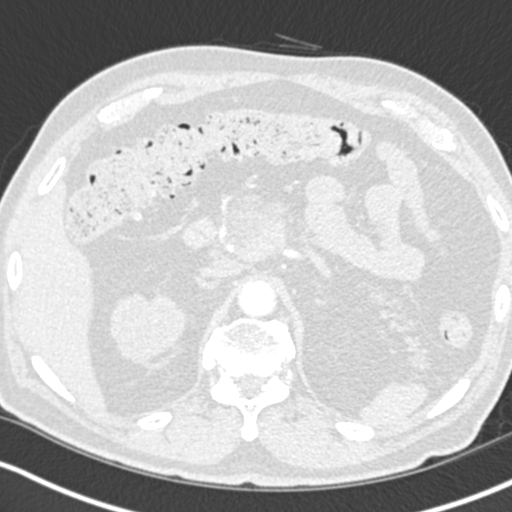
[im 54/355  soft-tissue]
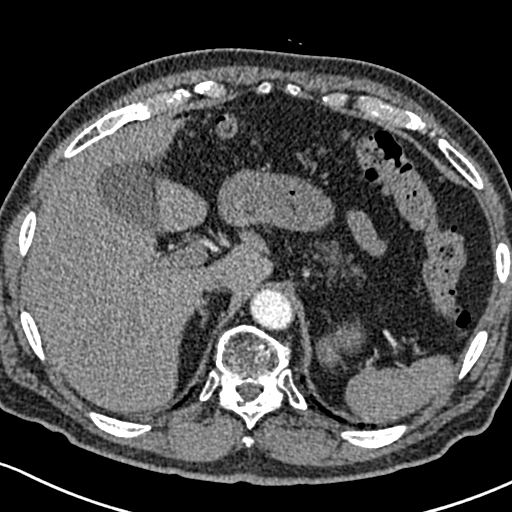
[im 71/355  lung]
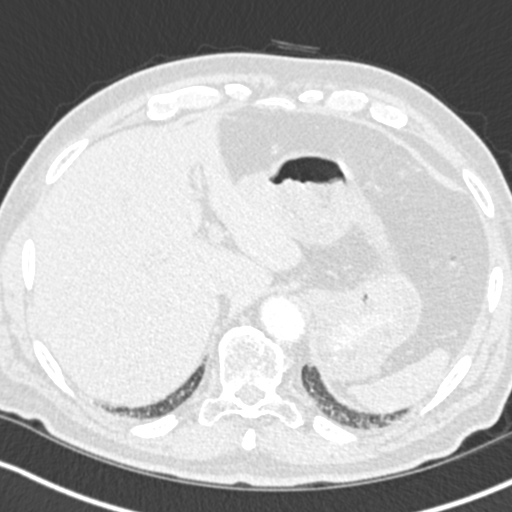
[im 89/355  soft-tissue]
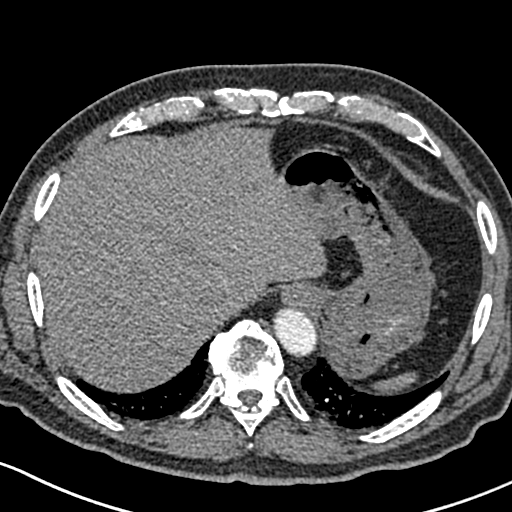
[im 124/355  lung]
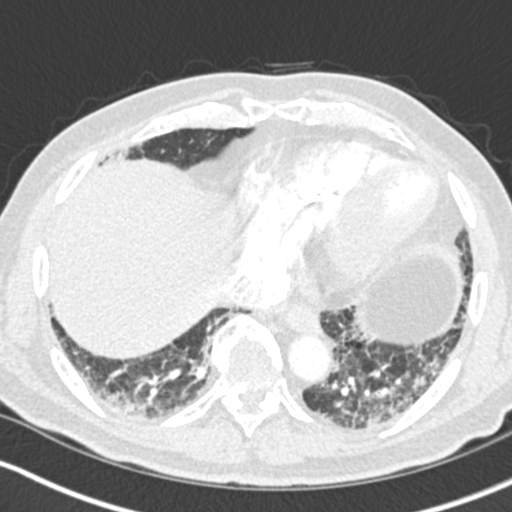
[im 142/355  soft-tissue]
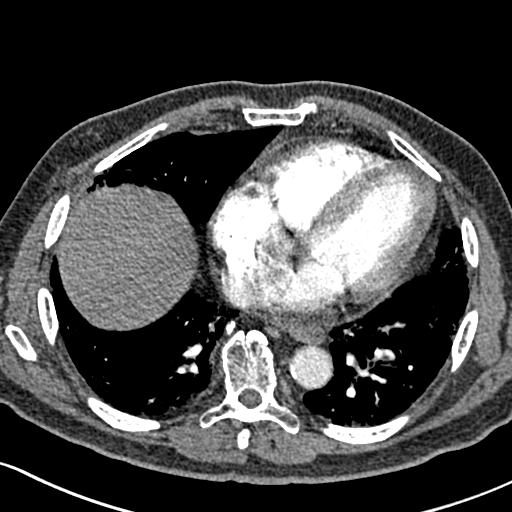
[im 160/355  lung]
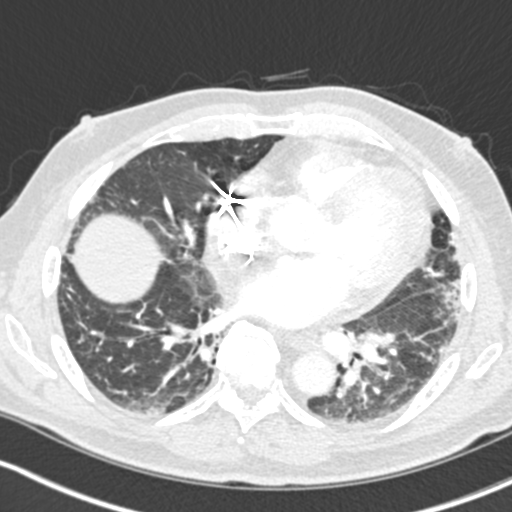
[im 195/355  soft-tissue]
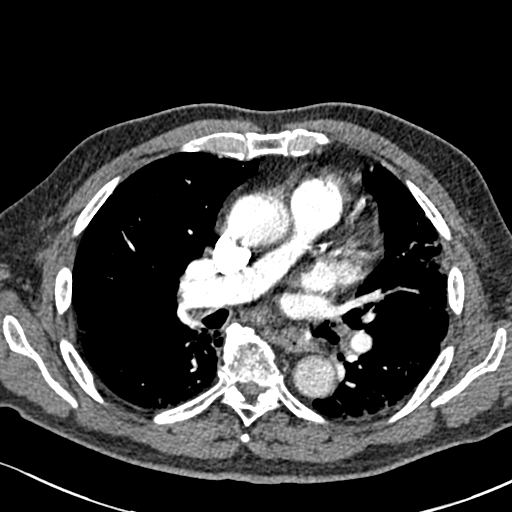
[im 213/355  lung]
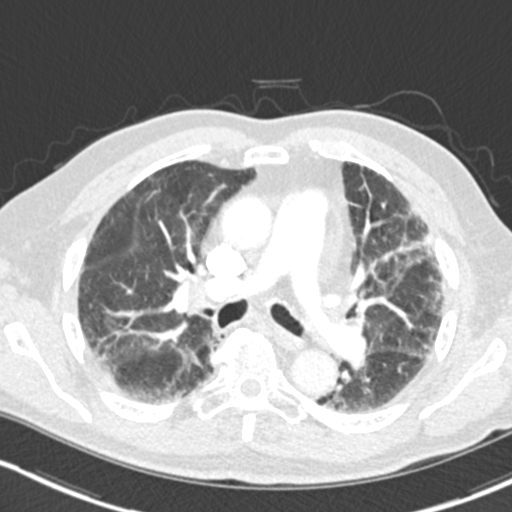
[im 231/355  soft-tissue]
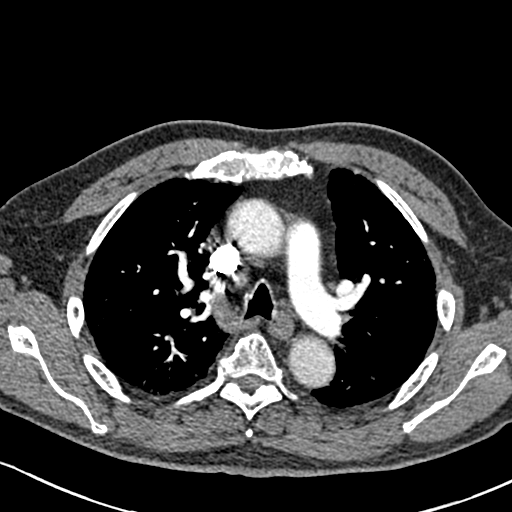
[im 266/355  lung]
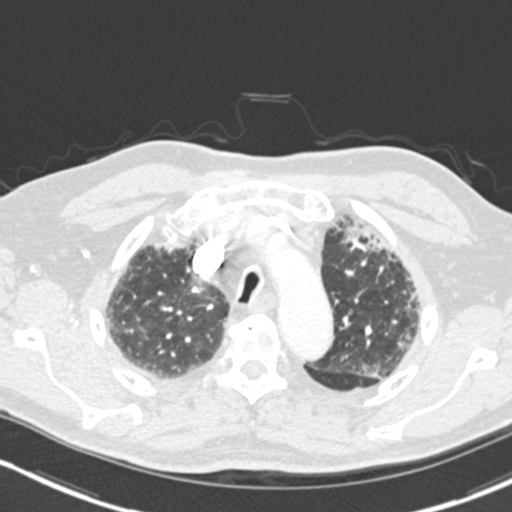
[im 284/355  soft-tissue]
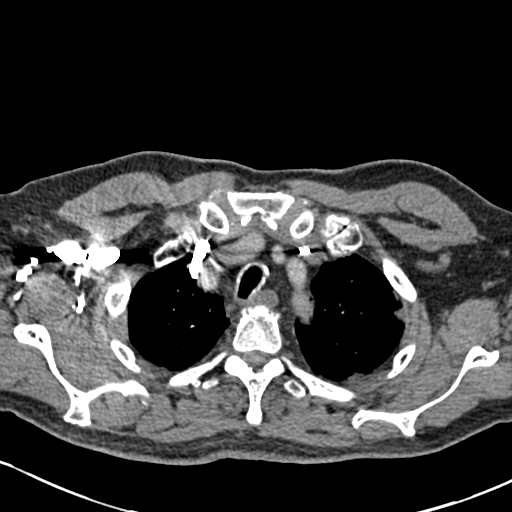
[im 301/355  lung]
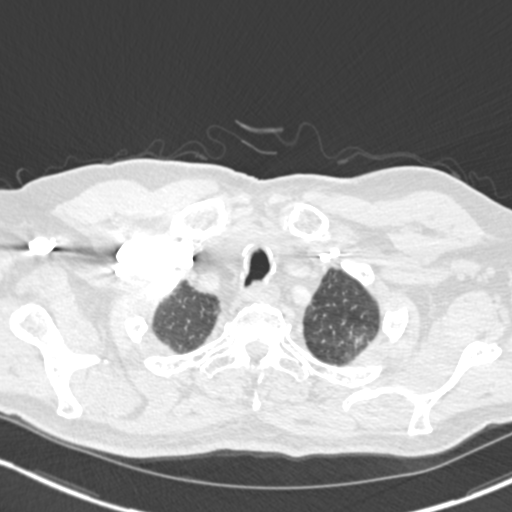
[im 337/355  soft-tissue]
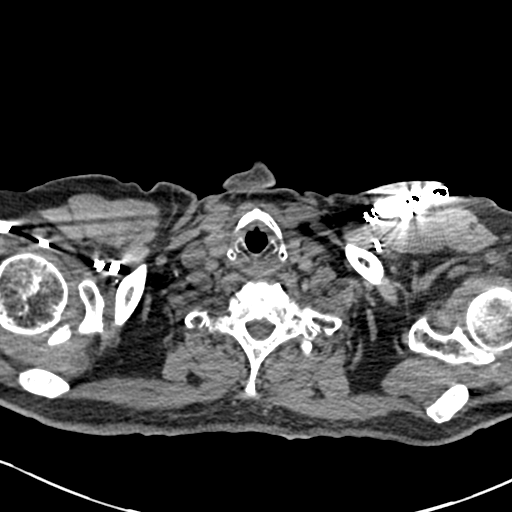

[Series 7: coronal mpr · coronal · 0.55mm/px · 3 of 87 slices shown]
[im 22/87  soft-tissue]
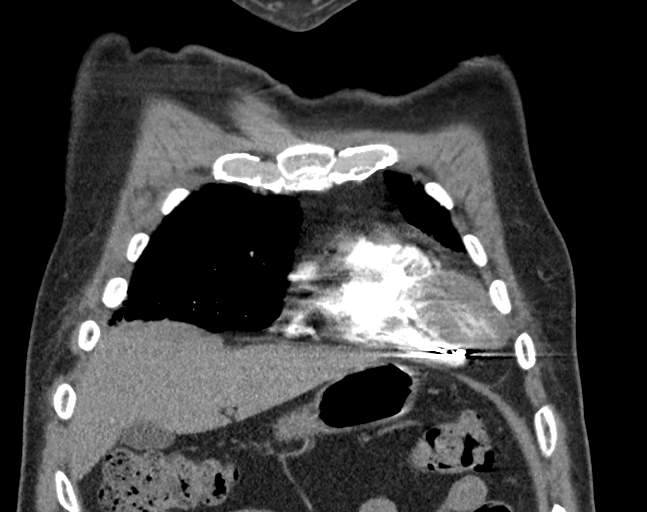
[im 44/87  soft-tissue]
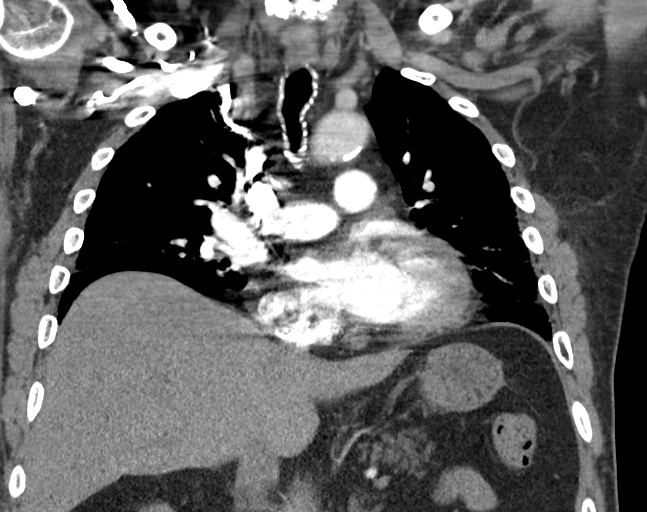
[im 65/87  soft-tissue]
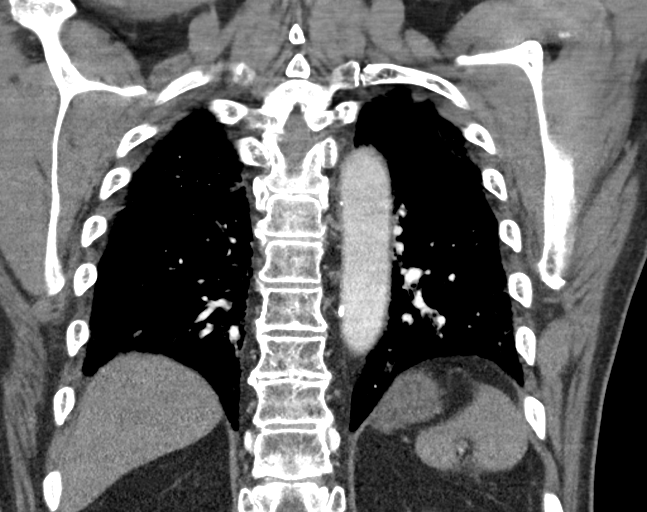

[17 of 46 positions shown; findings below may reference images not displayed]

RADIATION DOSE REDUCTION: This exam was performed according to the
departmental dose-optimization program which includes automated
exposure control, adjustment of the mA and/or kV according to
patient size and/or use of iterative reconstruction technique.

CONTRAST:  75mL OMNIPAQUE IOHEXOL 350 MG/ML SOLN
FINDINGS: Cardiovascular: Satisfactory opacification of the pulmonary arteries
to the segmental level. Previously noted saddle and bilateral lobar
pulmonary embolus is almost completely resolved, with minimal
residual nonocclusive subsegmental embolus present in the left lower
lobe (series 5, image 210). Cardiomegaly. Left coronary artery
calcifications. No pericardial effusion. Aortic atherosclerosis.

Mediastinum/Nodes: No enlarged mediastinal, hilar, or axillary lymph
nodes. Thyroid gland, trachea, and esophagus demonstrate no
significant findings.

Lungs/Pleura: Mild pulmonary fibrosis in a pattern with apical to
basal gradient featuring irregular peripheral interstitial opacity,
ground-glass, and some evidence of subpleural bronchiolectasis at
the lung bases. No clear evidence of honeycombing. No pleural
effusion or pneumothorax.

Upper Abdomen: No acute abnormality.

Musculoskeletal: No chest wall abnormality. No acute osseous
findings. Disc degenerative disease and ankylosis of the thoracic
spine.

Review of the MIP images confirms the above findings.
IMPRESSION: 1. Previously noted saddle and bilateral lobar pulmonary embolus is
almost completely resolved, with minimal residual nonocclusive
subsegmental embolus present in the left lower lobe.
2. Mild pulmonary fibrosis in a pattern with apical to basal
gradient featuring irregular peripheral interstitial opacity,
ground-glass, and some evidence of subpleural bronchiolectasis at
the lung bases. No clear evidence of honeycombing. Findings are
categorized as probable UIP per consensus guidelines: Diagnosis of
Idiopathic Pulmonary Fibrosis: An Official ATS/ERS/JRS/ALAT Clinical
Practice Guideline. Am J Respir Crit Care Med Vol 198, Lorraine 5,
[id number][DATE].
3. Cardiomegaly and coronary artery disease.

Aortic Atherosclerosis (6U1C9-582.2).

## 2024-02-23 ENCOUNTER — Other Ambulatory Visit

## 2024-03-02 ENCOUNTER — Ambulatory Visit: Admitting: Oncology
